# Patient Record
Sex: Female | Born: 1985 | Race: White | Hispanic: No | Marital: Married | Smoking: Never smoker
Health system: Southern US, Community
[De-identification: ages and names within clinical notes are randomized; demographics above are authoritative.]

## PROBLEM LIST (undated history)

## (undated) DIAGNOSIS — D219 Benign neoplasm of connective and other soft tissue, unspecified: Secondary | ICD-10-CM

## (undated) DIAGNOSIS — M7710 Lateral epicondylitis, unspecified elbow: Secondary | ICD-10-CM

## (undated) DIAGNOSIS — G5601 Carpal tunnel syndrome, right upper limb: Secondary | ICD-10-CM

## (undated) HISTORY — PX: DILATION AND CURETTAGE OF UTERUS: SHX78

## (undated) HISTORY — DX: Lateral epicondylitis, unspecified elbow: M77.10

## (undated) HISTORY — DX: Carpal tunnel syndrome, right upper limb: G56.01

## (undated) HISTORY — DX: Benign neoplasm of connective and other soft tissue, unspecified: D21.9

---

## 2012-04-29 ENCOUNTER — Emergency Department: Payer: Self-pay | Admitting: Emergency Medicine

## 2012-04-29 LAB — CBC
HGB: 13.5 g/dL (ref 12.0–16.0)
MCH: 32.3 pg (ref 26.0–34.0)
MCHC: 36 g/dL (ref 32.0–36.0)
RBC: 4.18 10*6/uL (ref 3.80–5.20)
RDW: 13 % (ref 11.5–14.5)
WBC: 5.2 10*3/uL (ref 3.6–11.0)

## 2012-04-29 LAB — URINALYSIS, COMPLETE
Glucose,UR: NEGATIVE mg/dL (ref 0–75)
Ketone: NEGATIVE
Ph: 6 (ref 4.5–8.0)
Protein: NEGATIVE
Specific Gravity: 1.004 (ref 1.003–1.030)
WBC UR: 1 /HPF (ref 0–5)

## 2012-04-29 LAB — HCG, QUANTITATIVE, PREGNANCY: Beta Hcg, Quant.: 41198 m[IU]/mL — ABNORMAL HIGH

## 2012-05-09 ENCOUNTER — Encounter: Payer: Self-pay | Admitting: Obstetrics and Gynecology

## 2012-07-14 ENCOUNTER — Ambulatory Visit: Payer: Self-pay | Admitting: Obstetrics & Gynecology

## 2012-07-15 ENCOUNTER — Ambulatory Visit: Payer: Self-pay | Admitting: Obstetrics & Gynecology

## 2012-07-16 ENCOUNTER — Observation Stay: Payer: Self-pay | Admitting: Obstetrics & Gynecology

## 2012-07-16 LAB — URINALYSIS, COMPLETE
Bilirubin,UR: NEGATIVE
Blood: NEGATIVE
RBC,UR: 2 /HPF (ref 0–5)
Specific Gravity: 1.031 (ref 1.003–1.030)
Squamous Epithelial: 2
WBC UR: 3 /HPF (ref 0–5)

## 2012-07-16 LAB — FETAL FIBRONECTIN

## 2012-07-16 LAB — WET PREP, GENITAL

## 2012-07-21 ENCOUNTER — Observation Stay: Payer: Self-pay

## 2012-07-21 LAB — URINALYSIS, COMPLETE
Bilirubin,UR: NEGATIVE
Ketone: NEGATIVE
Ph: 6 (ref 4.5–8.0)
RBC,UR: <1 /HPF (ref 0–5)
Specific Gravity: 1.008 (ref 1.003–1.030)

## 2012-07-21 LAB — FETAL FIBRONECTIN
Appearance: NORMAL
Fetal Fibronectin: POSITIVE

## 2012-07-23 LAB — URINE CULTURE

## 2012-07-26 ENCOUNTER — Observation Stay: Payer: Self-pay

## 2012-07-26 LAB — URINALYSIS, COMPLETE
Blood: NEGATIVE
Ketone: NEGATIVE
Leukocyte Esterase: NEGATIVE
Nitrite: NEGATIVE
Ph: 6 (ref 4.5–8.0)
Protein: NEGATIVE
RBC,UR: <1 /HPF (ref 0–5)
Specific Gravity: 1.017 (ref 1.003–1.030)
WBC UR: 1 /HPF (ref 0–5)

## 2012-08-01 ENCOUNTER — Encounter: Payer: Self-pay | Admitting: Maternal & Fetal Medicine

## 2012-08-01 ENCOUNTER — Observation Stay: Payer: Self-pay

## 2012-09-15 ENCOUNTER — Inpatient Hospital Stay: Payer: Self-pay

## 2012-09-15 LAB — CBC WITH DIFFERENTIAL/PLATELET
Basophil #: 0 10*3/uL (ref 0.0–0.1)
Eosinophil #: 0.1 10*3/uL (ref 0.0–0.7)
Eosinophil %: 1.4 %
Lymphocyte #: 1.8 10*3/uL (ref 1.0–3.6)
MCH: 31 pg (ref 26.0–34.0)
Monocyte #: 0.5 x10 3/mm (ref 0.2–0.9)
Platelet: 198 10*3/uL (ref 150–440)
RBC: 4.34 10*6/uL (ref 3.80–5.20)
WBC: 8.5 10*3/uL (ref 3.6–11.0)

## 2012-09-18 LAB — BETA STREP CULTURE(ARMC)

## 2012-09-26 ENCOUNTER — Observation Stay: Payer: Self-pay

## 2012-10-04 ENCOUNTER — Inpatient Hospital Stay: Payer: Self-pay | Admitting: Obstetrics and Gynecology

## 2012-10-04 LAB — CBC WITH DIFFERENTIAL/PLATELET
Basophil #: 0 10*3/uL (ref 0.0–0.1)
Basophil #: 0.1 10*3/uL (ref 0.0–0.1)
Basophil #: 0 10*3/uL (ref 0.0–0.1)
Basophil %: 0.1 %
Basophil %: 0.4 %
Basophil %: 0.1 %
Eosinophil #: 0.1 10*3/uL (ref 0.0–0.7)
Eosinophil #: 0 10*3/uL (ref 0.0–0.7)
Eosinophil %: 1.3 %
Eosinophil %: 0 %
Eosinophil %: 0 %
HCT: 37.1 % (ref 35.0–47.0)
HCT: 27.6 % — ABNORMAL LOW (ref 35.0–47.0)
HGB: 12.9 g/dL (ref 12.0–16.0)
HGB: 10.8 g/dL — ABNORMAL LOW (ref 12.0–16.0)
HGB: 9.5 g/dL — ABNORMAL LOW (ref 12.0–16.0)
Lymphocyte #: 1.8 10*3/uL (ref 1.0–3.6)
Lymphocyte #: 1.2 10*3/uL (ref 1.0–3.6)
Lymphocyte %: 19.8 %
Lymphocyte %: 5.4 %
MCH: 31.2 pg (ref 26.0–34.0)
MCH: 30.5 pg (ref 26.0–34.0)
MCH: 30.8 pg (ref 26.0–34.0)
MCHC: 34.7 g/dL (ref 32.0–36.0)
MCHC: 33.9 g/dL (ref 32.0–36.0)
MCHC: 34.4 g/dL (ref 32.0–36.0)
MCV: 90 fL (ref 80–100)
MCV: 90 fL (ref 80–100)
MCV: 90 fL (ref 80–100)
Monocyte #: 0.7 x10 3/mm (ref 0.2–0.9)
Monocyte #: 0.7 x10 3/mm (ref 0.2–0.9)
Monocyte %: 7.6 %
Monocyte %: 3.4 %
Neutrophil #: 6.4 10*3/uL (ref 1.4–6.5)
Neutrophil #: 18.5 10*3/uL — ABNORMAL HIGH (ref 1.4–6.5)
Neutrophil #: 19.7 10*3/uL — ABNORMAL HIGH (ref 1.4–6.5)
Neutrophil %: 71.2 %
Neutrophil %: 88.1 %
Neutrophil %: 91.1 %
Platelet: 173 10*3/uL (ref 150–440)
Platelet: 281 10*3/uL (ref 150–440)
Platelet: 277 10*3/uL (ref 150–440)
RBC: 4.13 10*6/uL (ref 3.80–5.20)
RBC: 3.54 10*6/uL — ABNORMAL LOW (ref 3.80–5.20)
RBC: 3.08 10*6/uL — ABNORMAL LOW (ref 3.80–5.20)
RDW: 17.1 % — ABNORMAL HIGH (ref 11.5–14.5)
RDW: 17.1 % — ABNORMAL HIGH (ref 11.5–14.5)
RDW: 17.3 % — ABNORMAL HIGH (ref 11.5–14.5)
WBC: 9 10*3/uL (ref 3.6–11.0)
WBC: 21.6 10*3/uL — ABNORMAL HIGH (ref 3.6–11.0)

## 2012-10-05 LAB — CBC WITH DIFFERENTIAL/PLATELET
Basophil %: 0.2 %
Eosinophil #: 0.1 10*3/uL (ref 0.0–0.7)
Eosinophil %: 0.4 %
HCT: 25.9 % — ABNORMAL LOW (ref 35.0–47.0)
Lymphocyte #: 2.7 10*3/uL (ref 1.0–3.6)
Lymphocyte %: 17.8 %
MCH: 30.1 pg (ref 26.0–34.0)
Monocyte #: 1.4 x10 3/mm — ABNORMAL HIGH (ref 0.2–0.9)
Neutrophil %: 72.6 %
Platelet: 202 10*3/uL (ref 150–440)
RBC: 2.9 10*6/uL — ABNORMAL LOW (ref 3.80–5.20)
RDW: 17.7 % — ABNORMAL HIGH (ref 11.5–14.5)
WBC: 15.4 10*3/uL — ABNORMAL HIGH (ref 3.6–11.0)

## 2012-10-07 LAB — HEMATOCRIT: HCT: 19.9 % — ABNORMAL LOW (ref 35.0–47.0)

## 2012-10-07 LAB — HEMOGLOBIN: HGB: 7 g/dL — ABNORMAL LOW (ref 12.0–16.0)

## 2012-10-08 LAB — CBC
HCT: 26.6 % — ABNORMAL LOW (ref 35.0–47.0)
MCHC: 34.5 g/dL (ref 32.0–36.0)
Platelet: 256 10*3/uL (ref 150–440)
RBC: 3 10*6/uL — ABNORMAL LOW (ref 3.80–5.20)
RDW: 17.3 % — ABNORMAL HIGH (ref 11.5–14.5)

## 2012-10-09 LAB — HEMATOCRIT: HCT: 27.8 % — ABNORMAL LOW (ref 35.0–47.0)

## 2013-12-27 IMAGING — US US OB >= 14 WKS - NRPT
1 series · 14 of 28 positions shown · non-contrast
Comparison: none

[Series 1: us ob >= 14 wks - nrpt · 0.18mm/px · 14 of 89 slices shown]
[im 4/89]
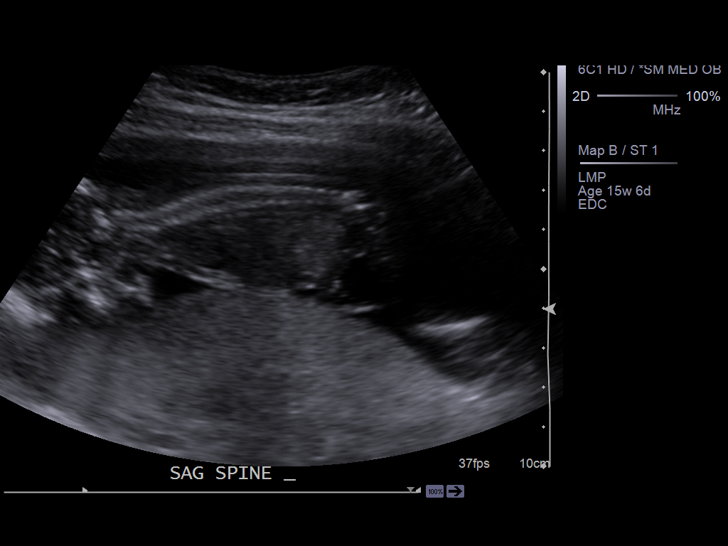
[im 10/89]
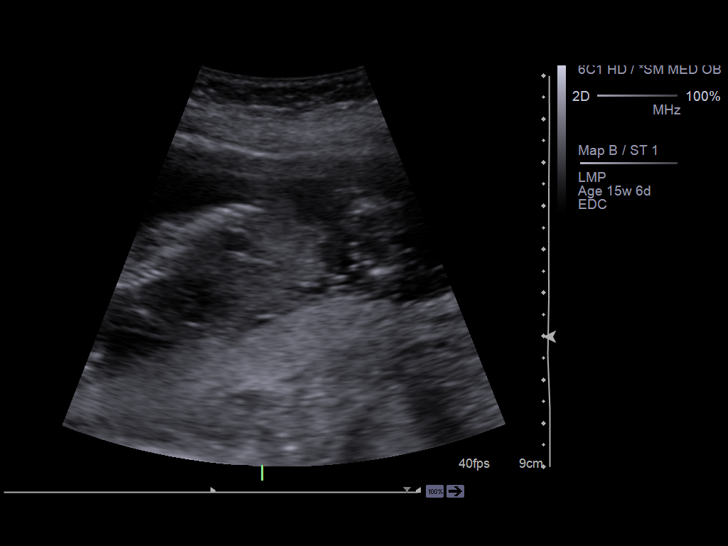
[im 17/89]
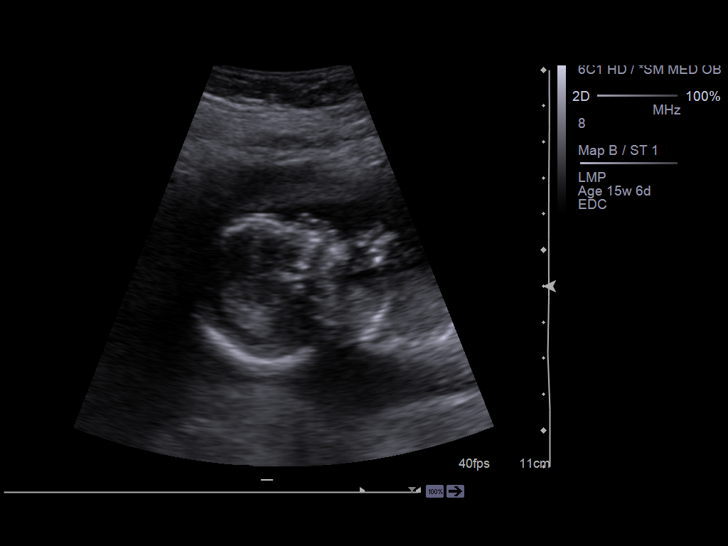
[im 23/89]
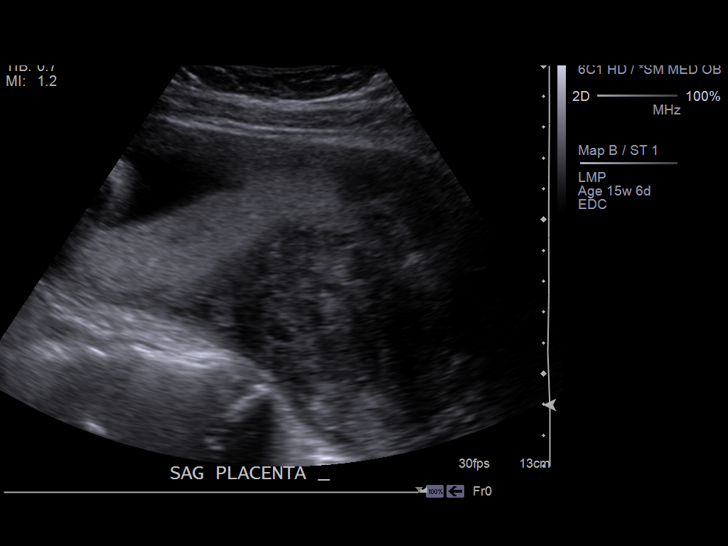
[im 30/89]
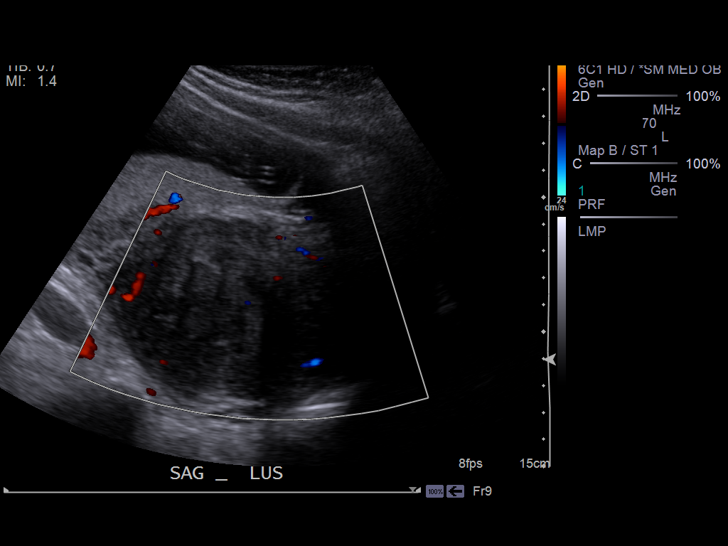
[im 36/89]
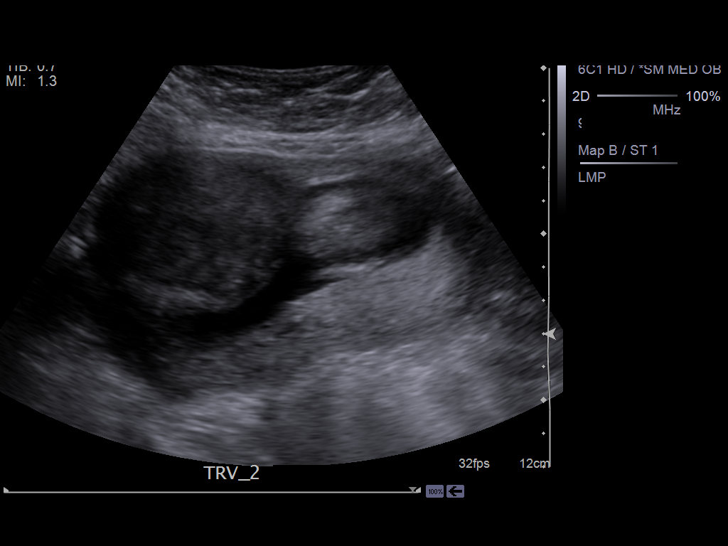
[im 43/89]
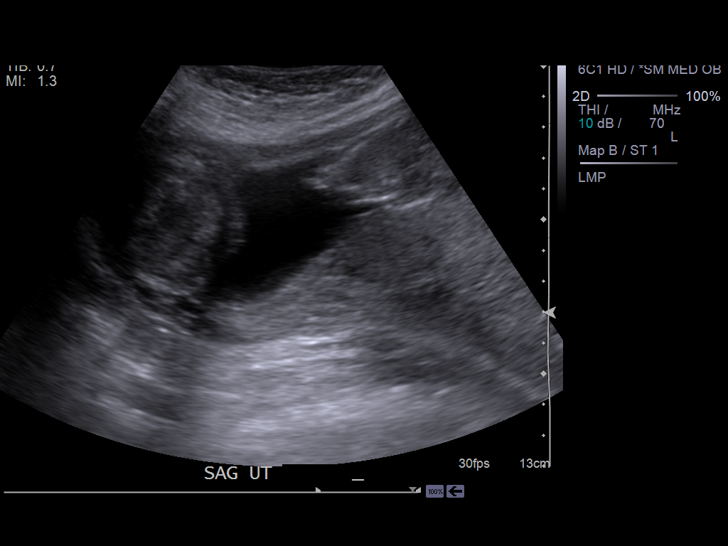
[im 49/89]
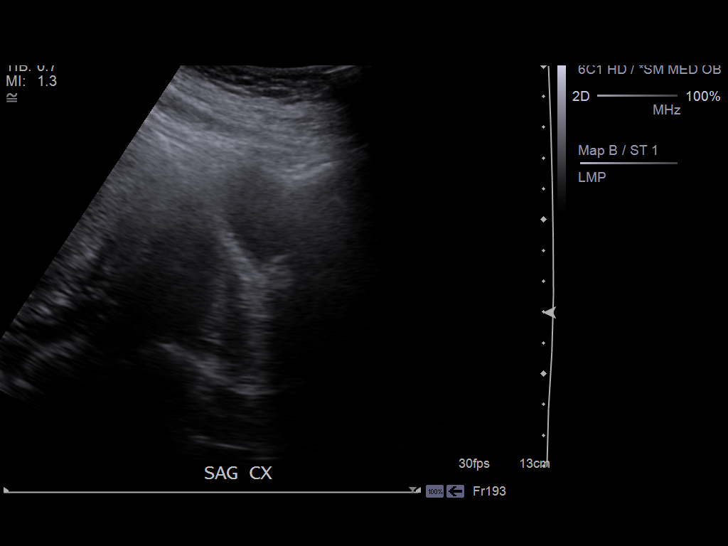
[im 56/89]
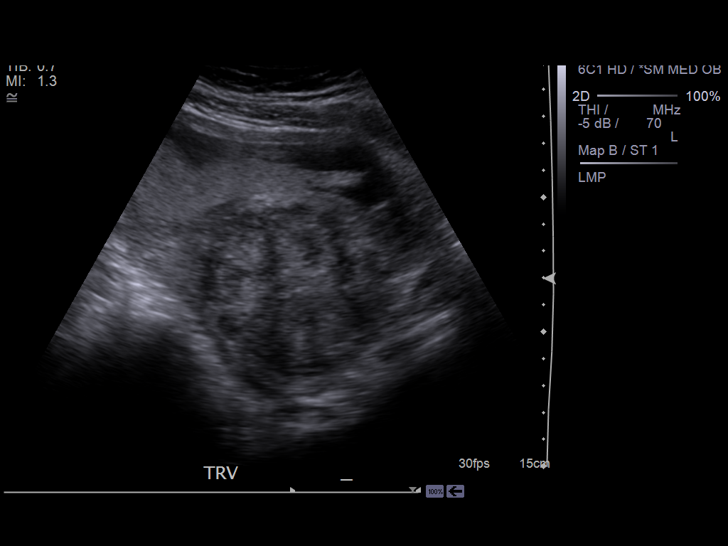
[im 62/89]
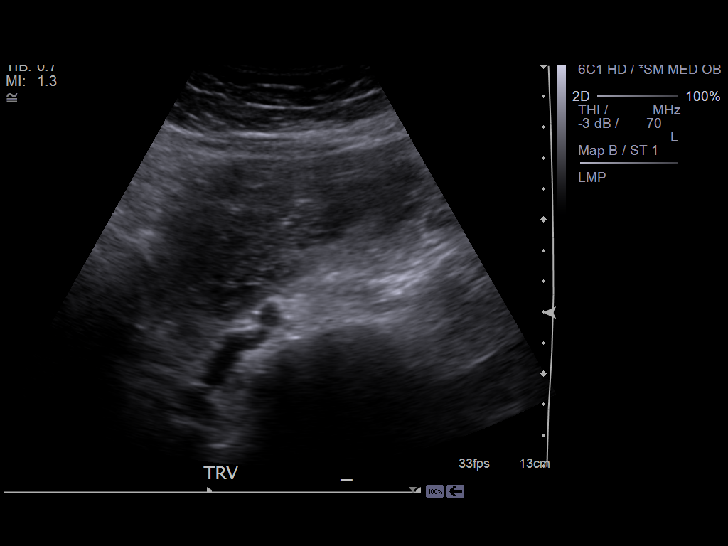
[im 69/89]
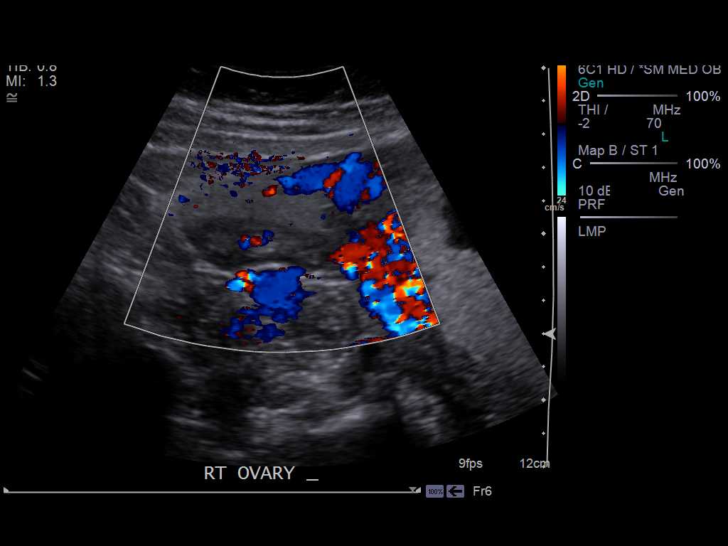
[im 75/89]
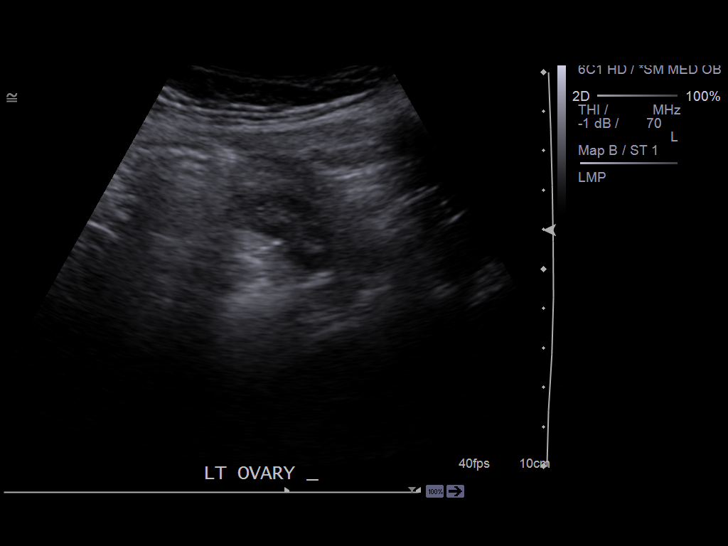
[im 82/89]
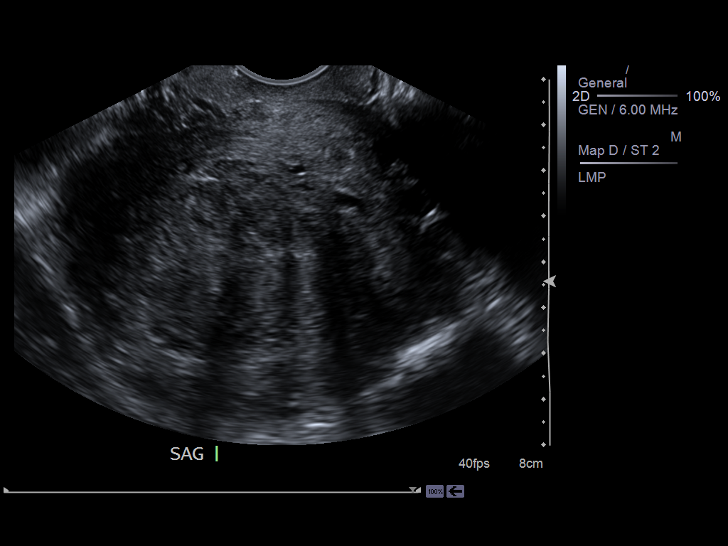
[im 89/89]
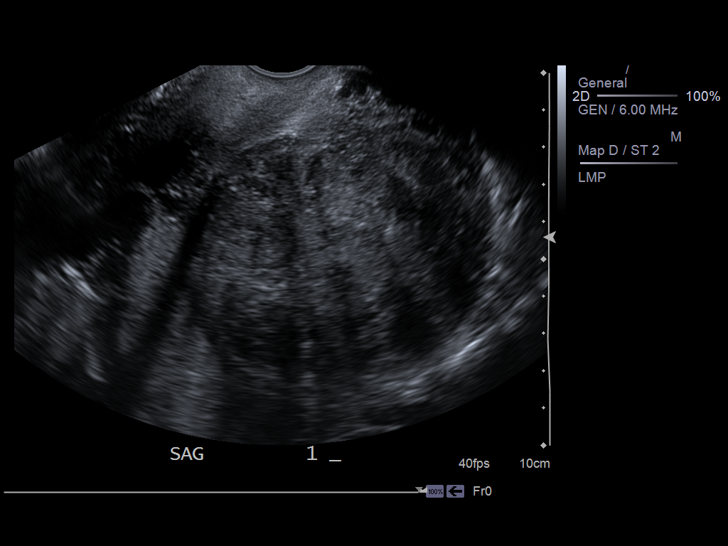

[14 of 28 positions shown; findings below may reference images not displayed]

IMAGES IMPORTED FROM THE [employer] WORKFLOW SYSTEM
NO DICTATION FOR STUDY

## 2014-03-21 IMAGING — US US OB DETAIL+14 WK - NRPT [facility]
1 series · 14 of 28 positions shown · non-contrast
Comparison: none

[Series 1: us ob detail+14 wk - nrpt [facility] · 0.29mm/px · 14 of 86 slices shown]
[im 4/86]
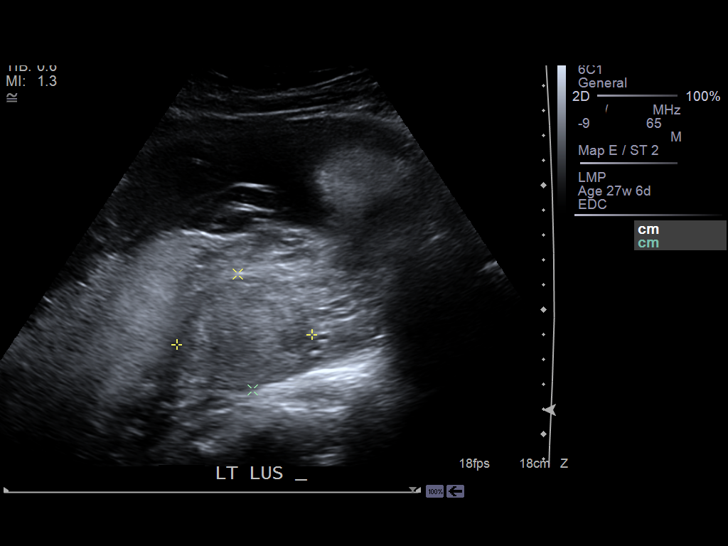
[im 10/86]
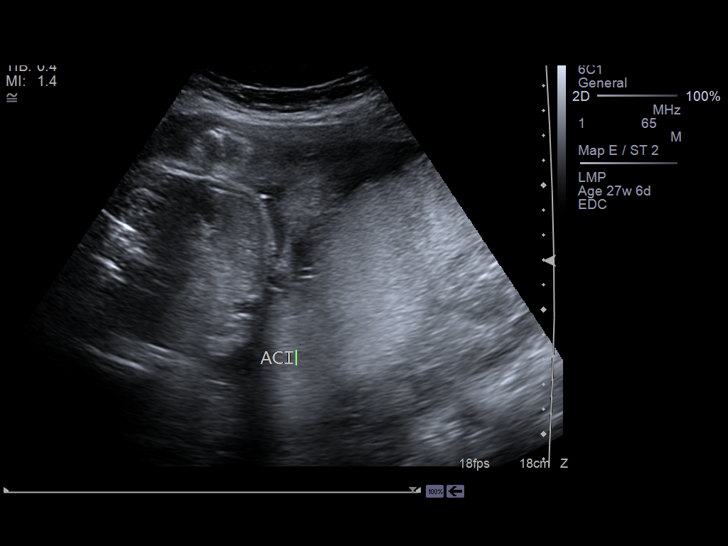
[im 16/86]
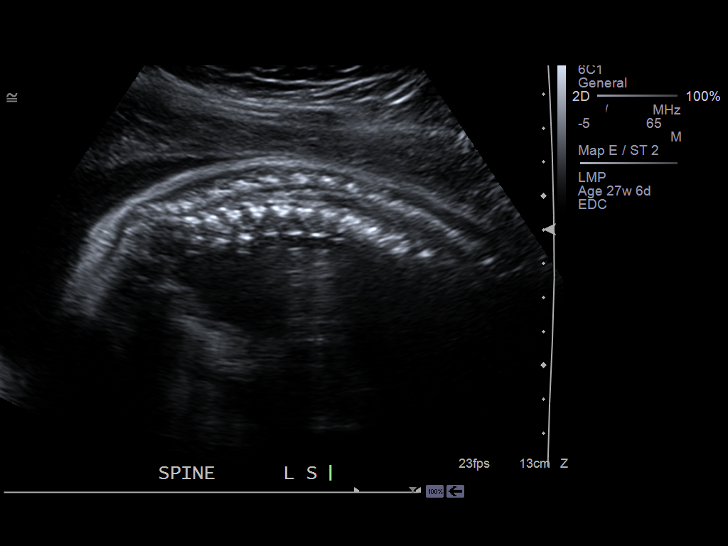
[im 23/86]
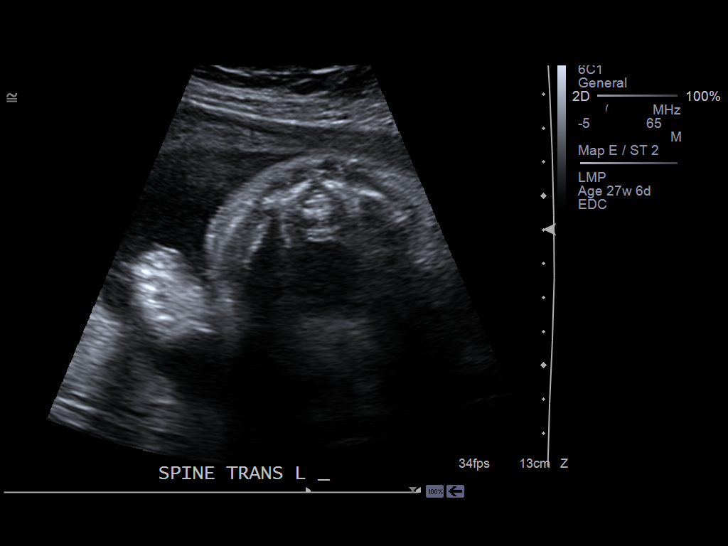
[im 29/86]
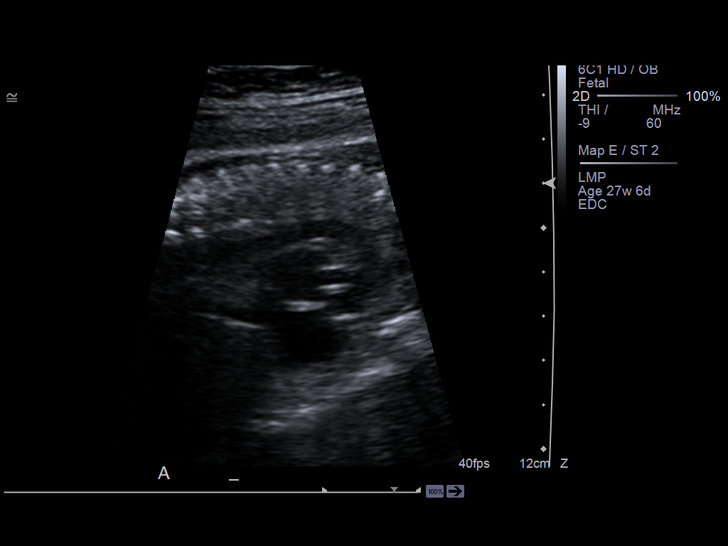
[im 35/86]
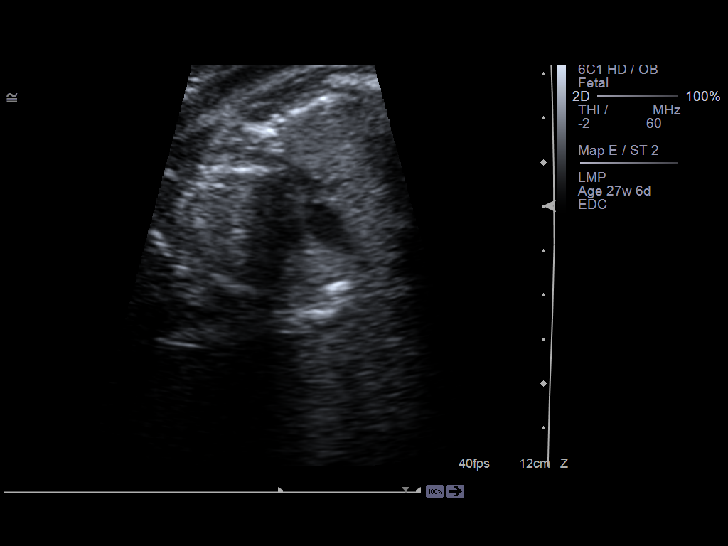
[im 41/86]
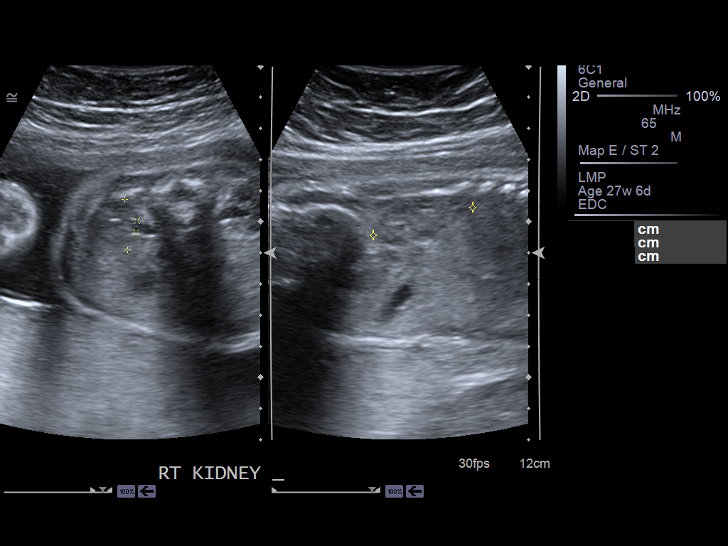
[im 48/86]
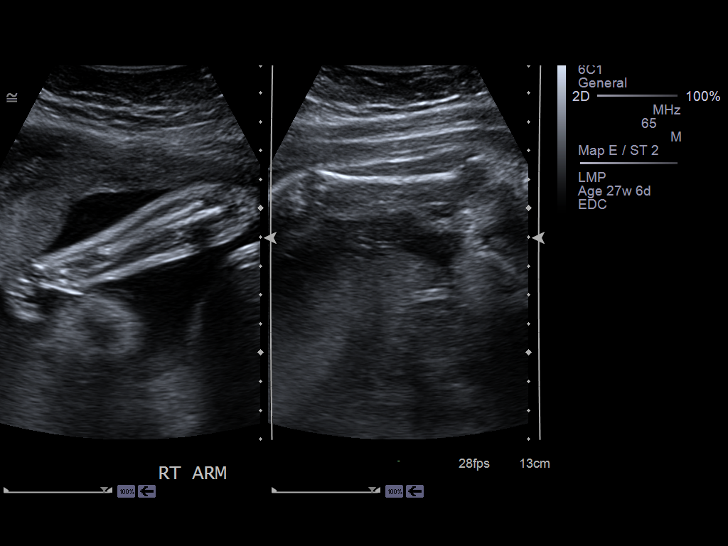
[im 54/86]
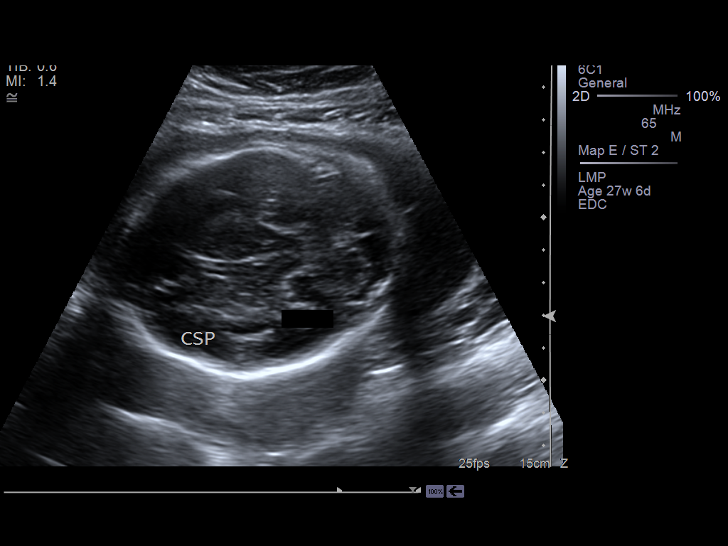
[im 60/86]
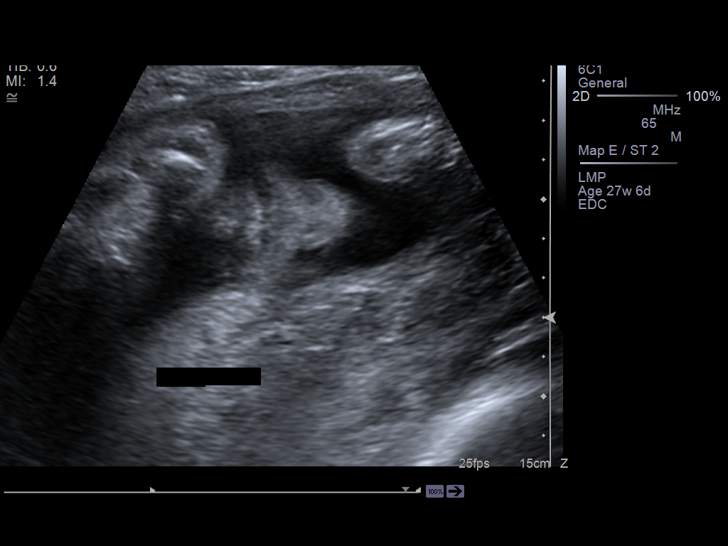
[im 67/86]
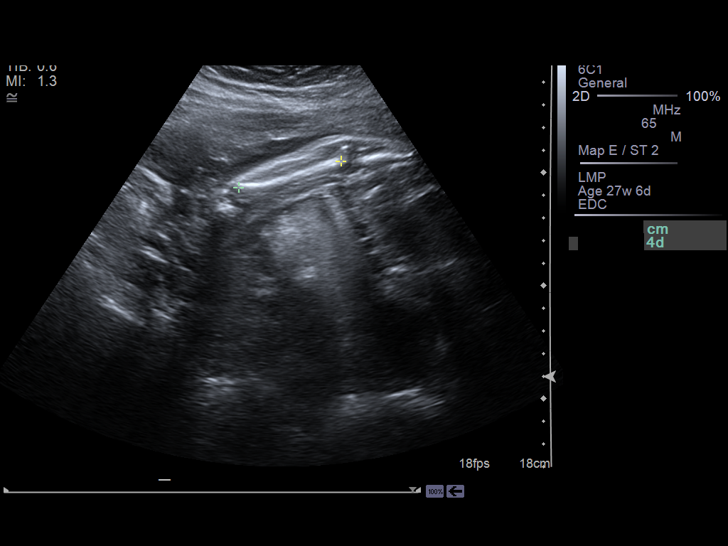
[im 73/86]
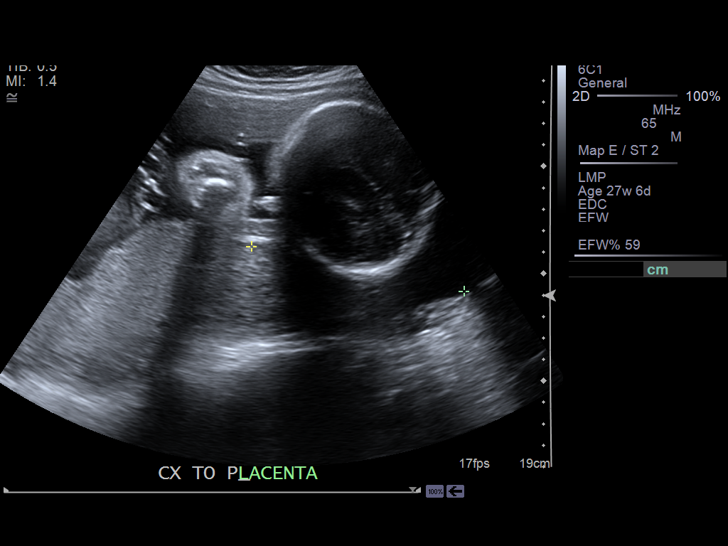
[im 79/86]
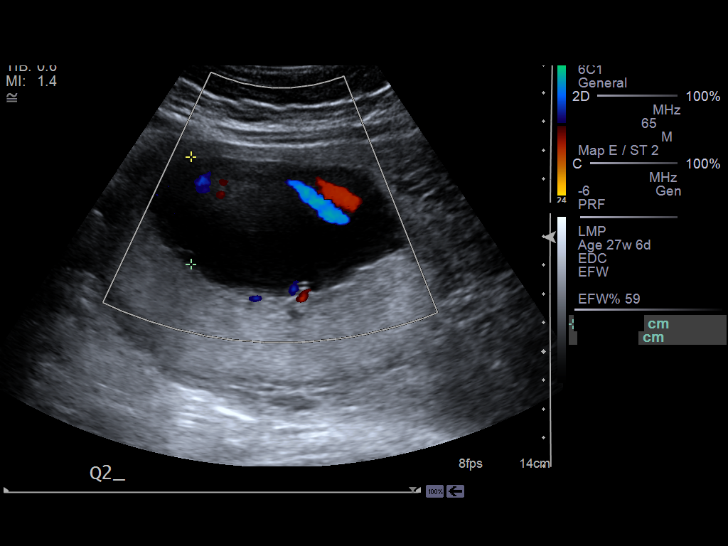
[im 86/86]
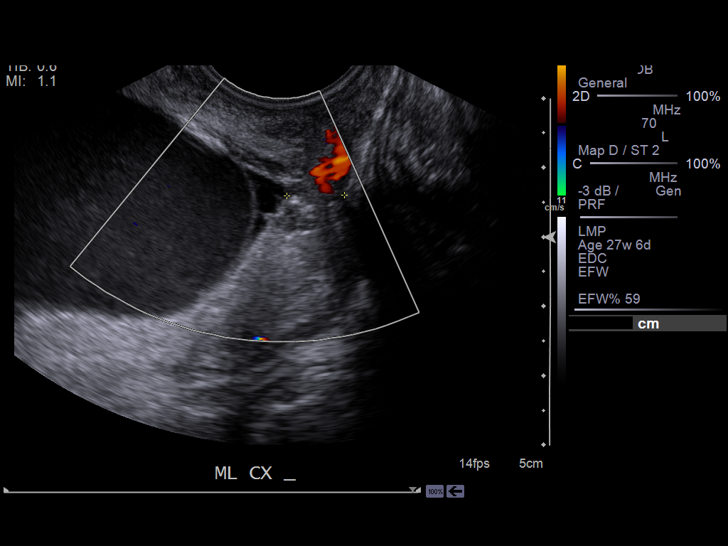

[14 of 28 positions shown; findings below may reference images not displayed]

IMAGES IMPORTED FROM THE [employer] WORKFLOW SYSTEM
NO DICTATION FOR STUDY

## 2014-05-24 IMAGING — US TRANSABDOMINAL ULTRASOUND OF PELVIS
2 series · 14 of 25 positions shown · non-contrast
Comparison: none

REASON FOR EXAM: PP Bleeding, Known Fibroids
COMMENTS:

[Series 1: transabdominal ultrasound of pelvis · 0.35mm/px · 13 of 31 slices shown (1 of 2)]
[im 1/31]
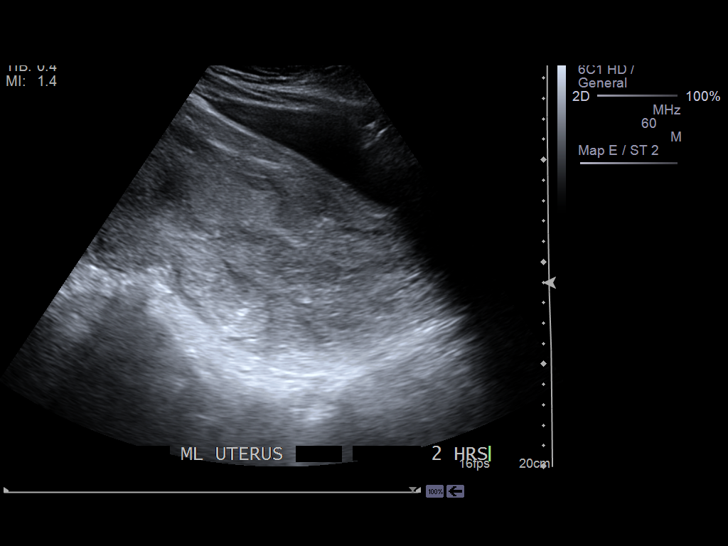
[im 3/31]
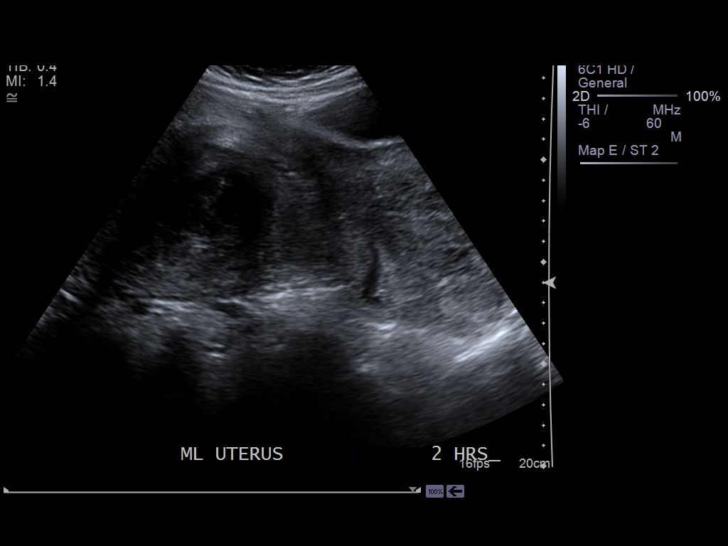
[im 6/31]
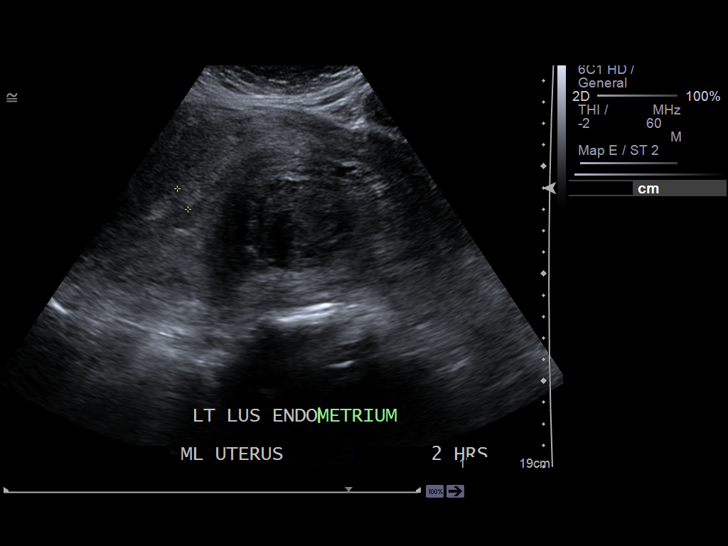
[im 8/31]
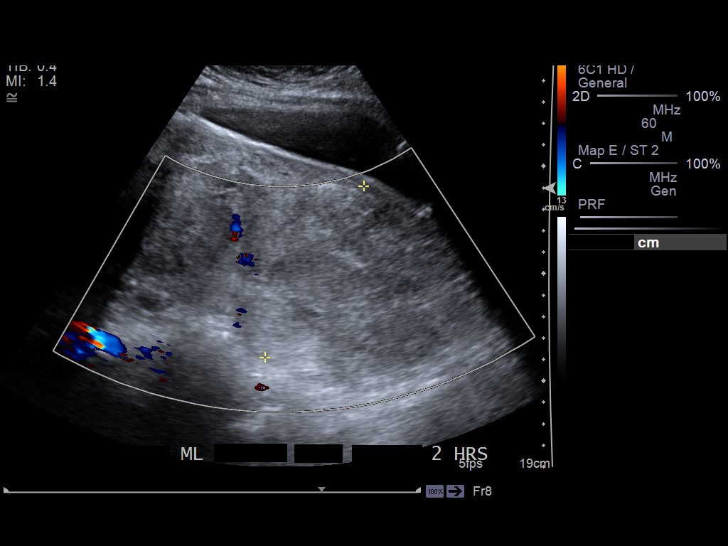
[im 11/31]
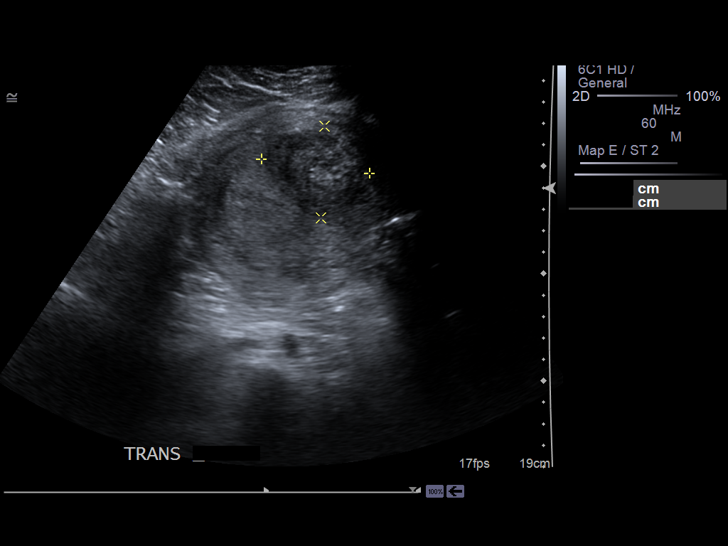
[im 12/31]
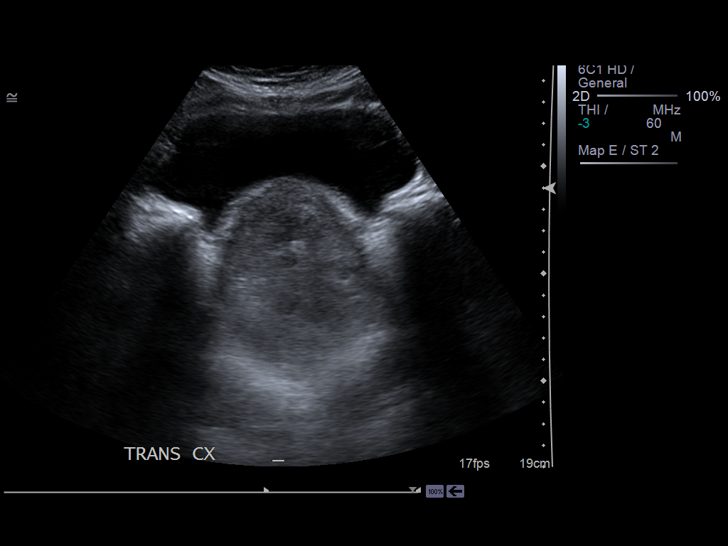
[im 15/31]
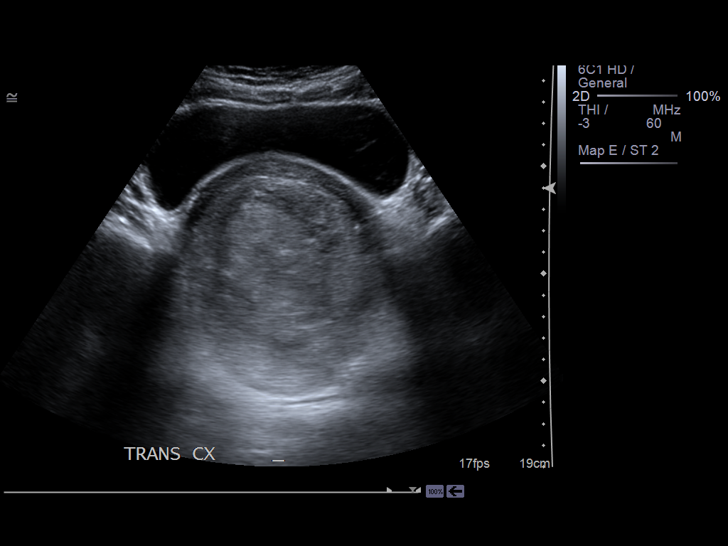
[im 17/31]
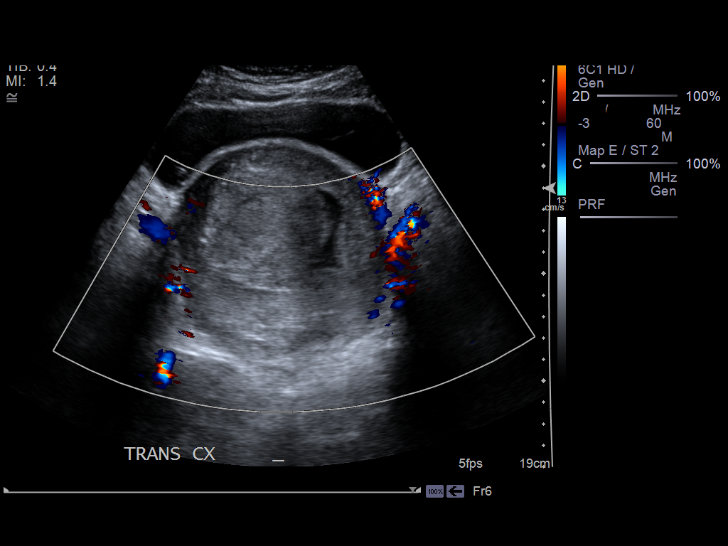
[im 20/31]
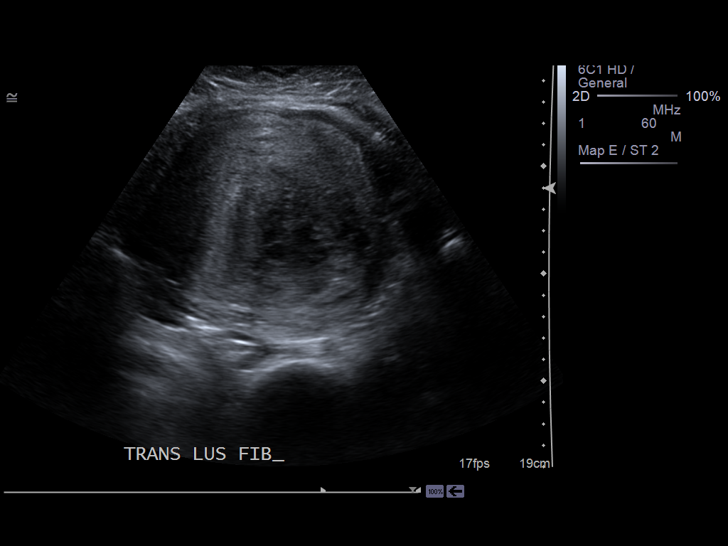
[im 21/31]
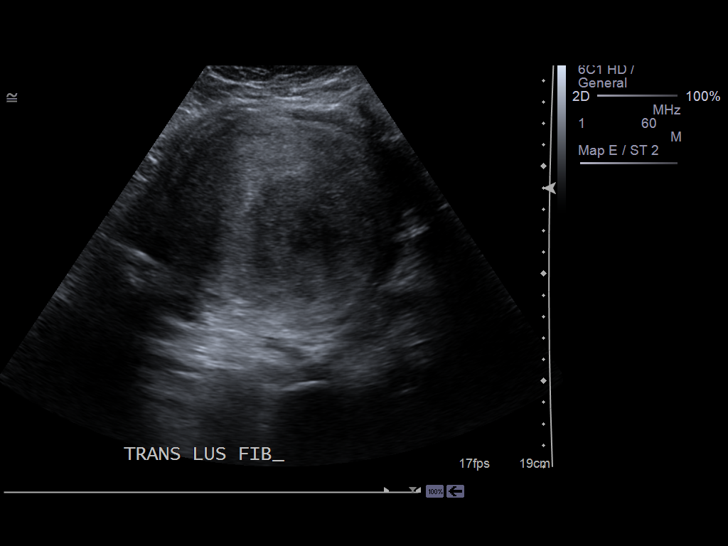
[im 24/31]
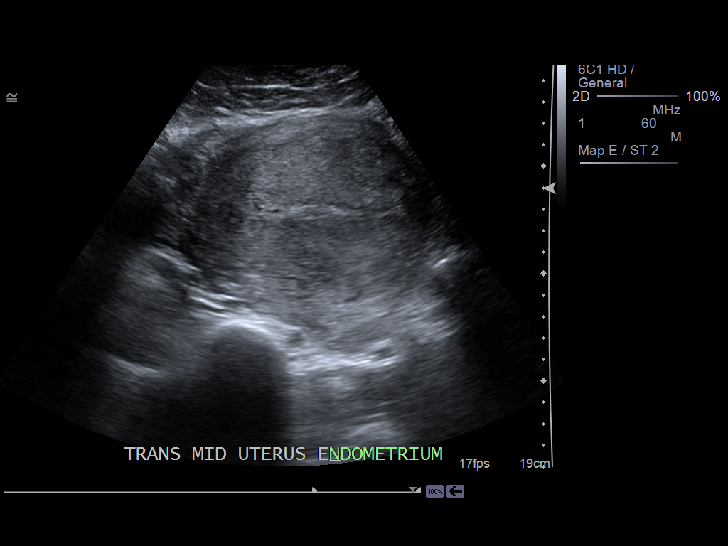
[im 27/31]
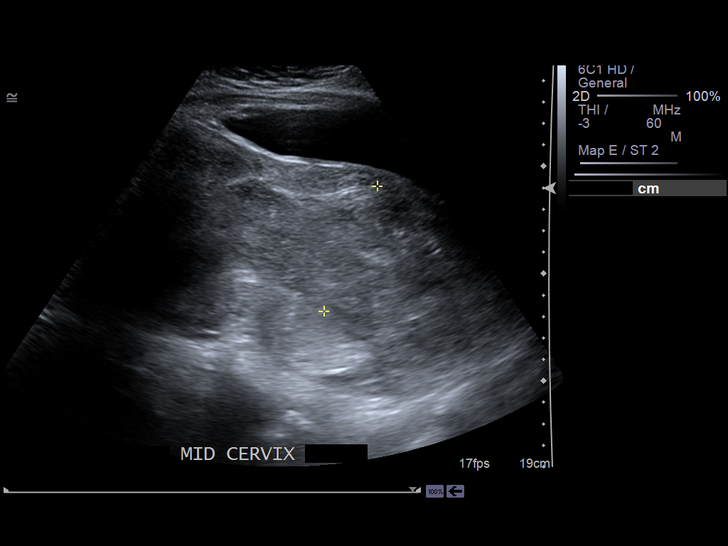
[im 29/31]
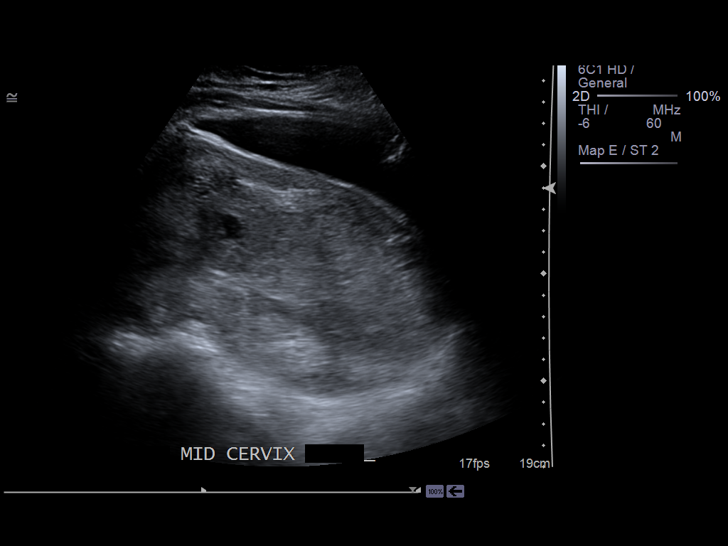

[Series 2: transabdominal ultrasound of pelvis · 0.35mm/px · 1 of 1 slices shown (2 of 2)]
[im 1/1]
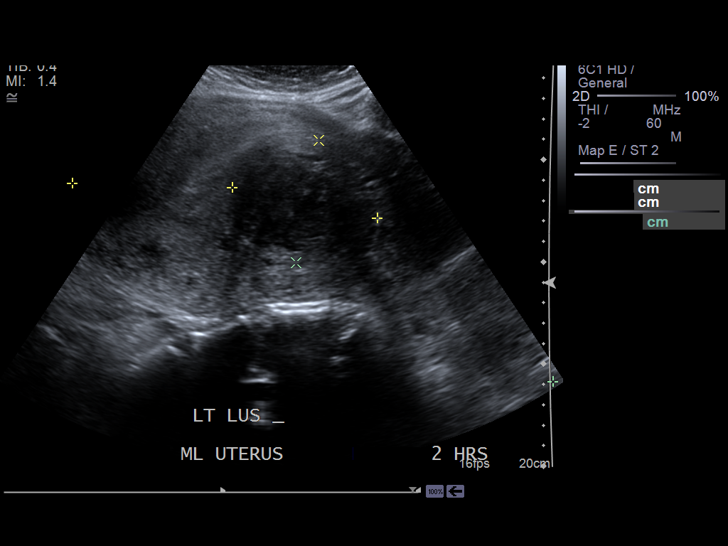

[14 of 25 positions shown; findings below may reference images not displayed]

PROCEDURE:     US  - US PELVIS EXAM  - October 04, 2012  [DATE]

RESULT:     Transabdominal and assessment demonstrates the uterus measures
up to approximately 25 cm in length by 11.20 x 9.53 cm. There is an abnormal
hypoechoic heterogeneous lesion in the fundus the uterus measuring 5.08 x
4.28 x 3.42 cm. There is a lower uterine segment posterior fibroid measuring
6.3/7 by 7.26 x 6.09 cm. Is an abnormal appearance of the cervix with a
mixed echotexture region which could represent evidence of clotted blood.
Placental remnants or not excluded. Correlate clinically. Endometrial stripe
thickness is 9.2 mm.
IMPRESSION: 1. Nonvisualization of the ovaries.
2. Enlarged postpartum uterus with 2 fibroids seen as demonstrated on
previous ultrasound.
3. Abnormal echotexture in the area the cervix which could represent clot.
Some retained placental component is not completely excluded.

[REDACTED]

## 2014-09-11 NOTE — Consult Note (Signed)
Referral Information:   Reason for Referral Fibroids in pregnancy    Referring Physician [facility]    Prenatal Hx Patient has a history of a hysteroscopic sumucosal fibroid resection in January of 2013 after a fibroid was discovered during a D&C after a miscarriage.  Currently, she has had an uncomplicated pregnancy until 12/6 (at 14+ weeks) when she woke up bleeding which lasted about 6 hours and she described as greater than a period.  She was evaluated in the ER and no obvious etiology for the bleeding was found.  She has 2 known fibroids which have been documented since 7 weeks 6 days and have remained essentially unchanged in size.  She had a repeat episode of bleeding on 12/7 which lasted for an hour but has had no bleeding since.  Hct on 12/6 was 37.5.    Past Obstetrical Hx 1.  Missed abortion 04/2011 - D&C (was about 8 weeks by LMP but no embryo seen in the uterus - D&C performed due to a lot of "tissue" but patient states it was not a molar pregnancy) 2.  Current   Home Medications: Medication Status  prenatal vitamijns daily Active   Allergies:   Sulfa drugs: Hives  Vital Signs/Notes:  Nursing Vital Signs: **Vital Signs.:   16-Dec-13 12:58   Vital Signs Type Routine   Temperature Temperature (F) 97   Celsius 36.1   Pulse Pulse 79   Respirations Respirations 18   Systolic BP Systolic BP 379   Diastolic BP (mmHg) Diastolic BP (mmHg) 82   Mean BP 95   Perinatal Consult:   PGyn Hx Irregular menses; known fibroids; no abnormal PAPs or STDs    PMed Hx Rubella Immune, Hx of varicella    Past Medical History cont'd Scoliosis - never treated    PSurg Hx D&C, hysteroscopic fibroid resection, moles removed    FHx Mom with diabetes and skin cancer; Dad s/p MI and subsequent stent placement; sister with scoliosis and recent 70 week PTD    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   ____________________________________________________________________________  History: Age: 29 years.  ____________________________________________________________________________  Dating:  LMP:  01/19/2012     EDC:     10/25/2012     GA by LMP:     [redacted]w[redacted]d  Earlier Assessment on:     03/14/2012     EDC:     10/25/2012     GA by earlier  assessment:     [redacted]w[redacted]d  Current Scan on:     05/09/2012     EDC:     10/21/2012     GA by current scan:    [redacted]w[redacted]d  Best Overall Assessment:     05/09/2012     EDC:     10/25/2012     Assessed  GA:      [redacted]w[redacted]d  The calculation of the gestational age by current scan was based on BPD, HC, FL  and HUM.  The Best Overall Assessment is based on an earlier assessment on  03/14/2012.  ____________________________________________________________________________  Anatomy Scan:  Singleton gestation.  Biometry:  BPD     33.9     mm      -     [redacted]w[redacted]d     ([redacted]w[redacted]d to [redacted]w[redacted]d)  HC     128.0     mm      -     [redacted]w[redacted]d     ([redacted]w[redacted]d to [redacted]w[redacted]d)  FL     20.4     mm      -     [redacted]w[redacted]d     ([redacted]w[redacted]d to [redacted]w[redacted]d)  HUM     21.1     mm      -     [redacted]w[redacted]d     Fetal heart activity: present. Fetal heart rate: 145 bpm.   Fetal presentation: breech.   Amniotic fluid: normal.   Placenta: posterior.     ____________________________________________________________________________  Maternal Structures:  Uterus: Fibroids: Fibroid 1: Size: 97 mm x 95 mm x 87 mm. Position: right  lateral anterior wall.   Fibroid 2: Size: 53 mm x 44  mm x 45 mm. Position: posterior.    Right Ovary: normal.   Right Ovary size: 40 mm x 17 mm x 17 mm. Volume: 6.1 ml.   Left Ovary: normal.   Left Ovary size: 39 mm x 15 mm x 17 mm. Volume: 5.2 ml.  ____________________________________________________________________________  Report Summary:  Impression: Single intrauterine pregnancy with an estimated gestational age of  [redacted] weeks 6 day(s).  Dating assigned by earliest available ultrasound performed  at [place] on 03/14/2012; measurements at that exam reported as 7 weeks  6 days.  Patient states menses range from every 28 to every 40 days.  Exam  limited to viability, biometry and mapping of uterine fibroids.    Due to early gestational age, detailed fetal anatomy was not  visualized.    There are 2 large fibroids identified.  The largest appears posteriorly in  the lower uterine segment, almost directly posterior to the cervix and is  retroplacental, measuring 9.7 x 9.5 x 8.7 cm (previously 9.5 x 8.8 x 9.6 cm).    The second is right lateral anterior/fundal and measures 5.3 x 4.4 x 4.5 cm  (previously 6.4 x 4.2 x 6.1 cm).      Maternal ovaries appear normal.    Transvaginal ultrasound was performed to assess cervical position which is  displaced anteriorly and measures at least 3.3 cm.    Findings were discussed.  See consult for details and recommendations.   Recommend follow up ultrasound in 2 weeks to assess fetal anatomy; we would be  more than happy to see the patient back for this if desired.  Would also  recommend serial ultrasounds for growth after 26-28 weeks. CODING DESCRIPTION:   Fibroid uterus.   Recommendations:     Thank you for allowing [place] to participate in her care.  Electronically signed by:  Wynona Neat, MD     Additional Lab/Radiology Notes First trimester screen results within normal range Maternal blood type Rh+   Impression/Recommendations:   Impression 29yo G2P0010 at [redacted]w[redacted]d with 2 large  fibroids and recent episode of bleeding.  Etiology of the bleeding is uncertain but is unlikely to be secondary to the  fibroids given the short duration.  We then discussed the implications and potential complications related to the fibroids and her pregnancy.    Recommendations The anterior/fundal fibroid appears mostly intramural and is unlikely to impact the pregnancy significantly given it's location, although it may grow in size.  The posterior, retroplacental, retrocervical fibroid very well may impact the ability of the patient to delivery vaginally.  Fortunately, is appears posterior which should make hysterotomy and repair less complicated although it would be important to closely evaluate the lower uterine segment anatomy given it's distortion of the cervical position and potential need to make a higher incision than anticipated.  This fibroid, given it's retroplacental position and size increases the patient's risk for abruption, preterm premature rupture of membranes, preterm labor/delivery, fetal growth restriction, possible malpresentation and probable need for cesarean section for delivery.  We discussed that fibroids can grow, stay the same size, or potentially get smaller during the course of a pregnancy.  They can also outgrow their blood supply, or degenerate, which can potentially cause an inflammatory reaction and contribute to preterm labor symptoms.    We discussed symptoms of preterm labor and abruption so that the patient is aware.  Concern for a short cervix is lessened given that this fibroid actually impinges/compresses the cervix.  Recommend ultrasound in about 2 weeks for fetal anatomy.  We would be more than happy to see the patient back for this indication if desired.  Would then recommend monthly ultrasounds about 26-28 weeks to assess growth and fibroid size.    Midtrimester would recommend anesthesia consult given patient's history of scoliosis.  Recommend follow up  with MFM at about 28-32 weeks to reevaluate fibroids/size/fetal growth and help with delivery recommendations.  Of note, the patient does have significant constipation with blood noted after bowel movements, which is likely secondary to the position of the LUS fibroid.  We discussed symptomatic options such as stool softeners.   Plan:   Prenatal Diagnosis Options Level II [place]    Ultrasound at what gestational ages Monthly > 28 weeks     Total Time Spent with Patient 30 minutes    >50% of visit spent in couseling/coordination of care yes    Office Use Only 99242  Level 2 (42min) NEW office consult exp prob focused   Coding Description: MATERNAL CONDITIONS/HISTORY INDICATION(S).   Fibroids.  Electronic Signatures: Wynona Neat (MD)  (Signed 16-Dec-13 14:42)  Authored: Referral, Home Medications, Allergies, Vital Signs/Notes, Consult, Exam, Radiology, Lab/Radiology Notes, Impression, Plan, Billing, Coding Description   Last Updated: 16-Dec-13 14:42 by Wynona Neat (MD)

## 2014-09-14 NOTE — Consult Note (Signed)
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 Pt required admission at [facility] for a short cervix and was discharged at 30 weeks . She was recently admitted to [facility] at 4-5 cm dilated and has remained unchanged since last thursday - consult obtained to discuss management . Pt notes she is at home alone during the day while her husband works 20 minutes away . She lives approximately 20 minutes from the hospital . She expresses some concern because earlier inthe pregnancy she did not feel contractions well .   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   Soc Hx married   Review Of Systems:  Subjective Frustrated with pregnancy difficulties     Routine BB:  24-Apr-14 19:36   ABO Group + Rh Type A Positive  Antibody Screen NEGATIVE (Result(s) reported on 15 Sep 2012 at 08:40PM.)  Crossmatch Unit 1 Cancelled  Crossmatch Unit 2 Cancelled  28-Apr-14 05:43   ABO Group + Rh Type A Positive  Antibody Screen NEGATIVE (Result(s) reported on 19 Sep 2012 at 07:21AM.)  Crossmatch Unit 1 Cancelled  Crossmatch Unit 2 Cancelled  Routine Micro:  24-Apr-14 17:14   Micro Text Report BETA STREP CULTURE   COMMENT                    NO BETA STREPTOCOCCUS ISOLATED IN 48 HOURS   ANTIBIOTIC                       Specimen Source VAGINAL  Culture Comment NO BETA STREPTOCOCCUS ISOLATED IN 48 HOURS  Result(s) reported on 18 Sep 2012 at 10:08AM.  General Ref:  24-Apr-14 19:36   RPR (STS) ========== TEST NAME ==========  ========= RESULTS =========  = REFERENCE RANGE =  RPR  RPR RPR                             [   Non Reactive         ]      Non Reactive               [facility]            No: [id number]           [address], [place], [place] [address]           Lindon Romp, MD         [phone]   Result(s) reported on 17 Sep 2012 at 03:49AM.  Routine Chem:  24-Apr-14 19:36   Result Comment XMATCH - PRODUCTS NOT INDICATED.HEMOGLOBIN IS  - 13.4  Result(s) reported on  17 Sep 2012 at 11:04AM.  28-Apr-14 05:43   Result Comment XMATCH - XMATCH CANCELED. NO INDICATIONS FOR  - RED CELL TRANSFUSION. SPOKE WITH  Baker Janus, MOTHER/BABY AT [phone] 09/19/2012.  - TFK  Result(s) reported on 19 Sep 2012 at 08:24AM.  Routine Hem:  24-Apr-14 19:36   WBC (CBC) 8.5  RBC (CBC) 4.34  Hemoglobin (CBC) 13.4  Hematocrit (CBC) 39.1  Platelet Count (CBC) 198  MCV 90  MCH 31.0  MCHC 34.4  RDW  16.6  Neutrophil % 70.6  Lymphocyte % 21.5  Monocyte % 6.4  Eosinophil % 1.4  Basophil % 0.1  Neutrophil # 6.0  Lymphocyte # 1.8  Monocyte # 0.5  Eosinophil # 0.1  Basophil # 0.0 (Result(s) reported on 15 Sep 2012 at 08:04PM.)    Additional Lab/Radiology Notes 3/25 u/s at [facility] - fibroid did not appear obstructive - report left on chart  4/28 bedside scan - ceph - fibroid posterior  to the right , no obvious obstruction cervix check - 5/60% /-2 no tension on cervix - I was not able to ballotte the fetal head out of the pelvis   Impression/Recommendations:  Impression 1 . Advanced dilation at 34 6/7 weeks - cervix stable for  several days, no evidence of infection, cephalic  2 . large fibroids do not appear  obstructive   Recommendations 1. I discussed risks /benefits of delivery versus expectant care in hospital versus home with advanced dilation. I reviewed signs sxs of ROM and labor .  At 34 6/7 weeks in absence of infection, I think expectant care at home is reasonable . Risk of cord prolapse is low with engaged head . Risk of uterine rupture in absence of obstructing fibroid is also low . There is a low risk in this nulliparous woman of precipitous delivery at home . I suggested she have someone in the home  with her if possible ,but  if labor starts and she is alone to call emergency services. Risk to the fetus of being born out of hospital if precipitous birth is less in this late premie  than early gestational ages. We discussed that as she approaches 37 weeks , it is a physician judgment call about whether to admit and induce to control birth timing and attempt to avoid unassisted birth or chorioamnionitis. I think prematurity exceeds this risk currently. 2 . we discussed future pregnancies briefly ( pt states this is an only child) and whether prophylactic cerclage would be indicated.Ithink this is unclear given she has achieved 34 weeks with activity modification.    Comments Plan induction/augmentation based on cervix and clinician judgment as pregnancy progresses. If no change consider induciton at 37-39 weeks .    Total Time Spent with Patient 15 minutes   >50% of visit spent in couseling/coordination of care yes   Office Use Only 99251  INPT Consult Problem Focused (20 min)   Coding Description: OTHER: advanced dilation at 34 6/7 weeks.  Electronic Signatures: Sharyn Creamer (MD)  (Signed 28-Apr-14 13:28)  Authored: Referral, Home Medications, Allergies, Vital Signs/Notes, Consult, Exam, Lab, Lab/Radiology Notes, Impression, Other Comments, Billing, Coding Description   Last Updated: 28-Apr-14 13:28 by Sharyn Creamer (MD)

## 2014-09-14 NOTE — Consult Note (Signed)
   Maternal Age 29   Gravida 2   Para 0   Term Deliveries 0   Preterm Deliveries 0   Abortions 1   Living Children 0   GA Assessment: (Weeks) 34 week(s)   (Days) 2 day(s)   Gestation Single   Blood Type (Maternal) A positive   Antibody Screen Results (Maternal) negative   Indirect Coombs Negative   HIV Results (Maternal) negative   Herpes Results (Maternal) n/a   VDRL/RPR/Syphilis Results (Maternal) negative   Rubella Results (Maternal) immune   Hepatitis B Surface Antigen Results (Maternal) negative   Group B Strep Results Maternal (Result >5wks must be treated as unknown) unknown/result > 5 weeks ago   Prenatal Care Adequate    Additional Comments Mrs. Sieg has been followed by [facility] and [facility] for preterm labor (has received betamethasone x 2 courses) and her large leiomyomas.  She is s/p hysteroscopic myomectomy.  Mother presents to L&D at 4 cm dilated with bulging membranes.    I discussed with both parents the expected course of a 34 week infant including respiratory support, feeding difficulties, temperature regulation, likely length of stay, etc.  Parents asked appropriate questions.  I am available for further questions as they arise.  Will plan to be present for delivery and admit baby to special care nursery.   Electronic Signatures: Darreld Mclean (MD)   (Signed 25-Apr-14 19:28)  Co-Signer: PREGNANCY and LABOR, ADDITIONAL COMMENTS Shammara Jarrett (NP)   (Signed 24-Apr-14 21:17)  Authored: PREGNANCY and LABOR, ADDITIONAL COMMENTS  Last Updated: 25-Apr-14 19:28 by Darreld Mclean (MD)

## 2014-10-02 NOTE — H&P (Signed)
L&D Evaluation:  History Expanded:  HPI 29 yo G2P0010 whose EDC 10/25/12, presents at 36 6/7 weeks with c/o leaking fluid.  Pt's prenatal course has been followed at [facility], [facility] and [facility] in [place].  Pt has had multiple concerns with PTL, has recieved 2 seperate courses of beta methasone, several known large leiomyomas, s/p hysteroscopic myomectomy. Per [facility], pt is a candidate for vaginal delivery.   Gravida 2   Term 0   PreTerm 0   Abortion 1   Living 0   Blood Type (Maternal) A positive   Group B Strep Results Maternal (Result >5wks must be treated as unknown) negative   Maternal HIV Negative   Maternal Syphilis Ab Nonreactive   Maternal Varicella Immune   Rubella Results (Maternal) immune   EDC 25-Oct-2012   Presents with contractions   Patient's Medical History No Chronic Illness   Patient's Surgical History D&C  hysterosocpic myomectomy   Medications Pre Natal Vitamins   Allergies Sulfa, hives, non-paper tape   Social History none   Current Prenatal Course Notable For see above   Exam:  Vital Signs stable   General no apparent distress   Mental Status clear   Abdomen gravid, tender with contractions   Estimated Fetal Weight Average for gestational age   Pelvic 5/100/-2, unchanged after 1.5 hrs   Mebranes Intact   FHT normal rate with no decels   Ucx period of ctx q 2-3 min, spaced out to q 6-8 min   Impression:  Impression IUP at [redacted]w[redacted]d, advanced dilation, no cervical change   Plan:  Comments Discussed with pt indication for induction/augmentation of labor. Offered pain medication or medication to help her sleep, which she declines. Also offered to continue monitoring overnight, which she also declines.   Follow Up Appointment already scheduled. 5/8   Electronic Signatures: Ander Purpura (CNM)  (Signed 05-May-14 23:16)  Authored: L&D Evaluation   Last Updated: 05-May-14 23:16 by Ander Purpura (CNM)

## 2014-10-02 NOTE — H&P (Signed)
L&D Evaluation:  History Expanded:  HPI 29 yo G2P0010 whose [place] = June 3.  Pt [facility] ad[facility] for this pregnancy.  Preg complicated bu increasing leiomyomas.  "I have one posterior that is pushing up my cervix.  Pt is s/p hysteroscopic myomectomy.  She has already recieved beta methasone and got her second dose of 17P progesterone today. Pt has had low back pain for the last week.   Presents with back pain   Patient's Medical History No Chronic Illness   Patient's Surgical History Dilation and evacuation, hysteroscopic myomectomy   Medications Pre Natal Vitamins  Iron  Flagyl, Miralac, 17P, beta methasone   Allergies Sulfa, rash   Current Prenatal Course Notable For leiomyoa   Exam:  Vital Signs stable   General no apparent distress   Mental Status clear   Chest clear   Heart normal sinus rhythm   Abdomen gravid, non-tender   Estimated Fetal Weight Average for gestational age   Back no CVAT   Pelvic (WS) 2 cm, 80%   Mebranes Intact   FHT normal rate with no decels   Impression:  Impression Poss PTL   Plan:  Plan UA, monitor contractions and for cervical change   Electronic Signatures: Rosina Lowenstein (MD)  (Signed 27-Feb-14 16:54)  Authored: L&D Evaluation   Last Updated: 27-Feb-14 16:54 by Rosina Lowenstein (MD)

## 2014-10-02 NOTE — H&P (Signed)
L&D Evaluation:  History:  HPI 29 yo G2P0010 at [redacted]w[redacted]d gestational age by LMP consistent with early ultrasound.  Pregnancy complicated by multiple large uterine fibroids (8 and 7cm).  She also has as history of myomectomy that was submucosal, hysteroscopically.  She recently had an ultrasound that showed a cervical length of 75mm.  She has been referred to [facility] perinatal for management recommendations.  She has received a course of betamethasone which was complete this afternoon (48 hours from first dose).  She also received her first injection of 17OHPC two days ago.   She presented today with low back pain since yesterday.  She has also had consistent all-day rectal pressure and has had difficulty having a bowel movement. She denies vaginal bleeding and leakage of fluid. She notes positive fetal movement.  She denies fevers, urinary symptoms, vaginal symptoms.   A+/RI/VZI   Presents with back pain   Patient's Medical History fibroid uterus   Patient's Surgical History 1) birthmark excision, 2) D&C (SAB @ 8 weeks), 3) hysteroscopy with leiomyomectomy, 4) mole removal   Medications Pre Natal Vitamins   Allergies Sulfa, hives   Social History none   ROS:  ROS All systems were reviewed.  HEENT, CNS, GI, GU, Respiratory, CV, Renal and Musculoskeletal systems were found to be normal., unless noted in HPI   Exam:  Vital Signs stable   General no apparent distress   Mental Status clear   Chest clear   Heart normal sinus rhythm   Abdomen gravid, non-tender   Estimated Fetal Weight normal size for gestational age   Back no CVAT   Edema no edema   Pelvic no external lesions, SSE: cervix appears closed, SVE: closed/50/high   Mebranes Intact   FHT normal rate with no decels   FHT Description 140/mod var   Ucx irritability   Skin no lesions   Impression:  Impression dehydration, back pain, shortened cervix   Plan:  Plan UA, monitor contractions and for cervical change,  fluids   Comments 1) back pain: uterine irritability, UA not suggestive of etiology.  She is dehydrated with a high specifica gravity and she demonstrates uterine irritability.  Will hydrate her with LR 1 liter x 1.  Prior to discharge, her back pain has resolved. 2) Short cervix with risk of preterm delivery: fFN +, discussed these results with patient and that this test is much better as an indicator when it is negative.  Still a positive result is not reassuring.  her cervix is closed and she has received BMTZ and 17OHPC. Patient has been monitored and her contractions have slowed significantly. Dr. Laurey Morale has requested a [facility] consult for management recommendations.  Likely will continue progesterone, either IM or via vaginal suppository. 3)  BV: Will treat with 7 day course of flagyl.  She has received her initial dose here. 4) constipation: have recommended that she switch to miralax, 17grams once daily and discontinue docusate.  May need to consider an enema at some point. 5) Dispo: Home with strict precautions to return if her back pain returns.  She is given strict instructions and advice on how to hydrate herself and stay hydrated.   Follow Up Appointment already scheduled. in 5 days at [facility] in [place] clinic   Labs:  Lab Results:  BF Analysis:  22-Feb-14 20:03   Fetal Fibronectin (comp) POSITIVE-Fetal Fibronectin DETECTED.  Appearance NORMAL  Routine Micro:  22-Feb-14 20:03   Micro Text Report WET PREP   COMMENT  MODERATE WHITE BLOOD CELLS SEEN   COMMENT                   CLUE CELLS SEEN   COMMENT                   NO TRICHOMONAS SEEN   COMMENT                   NO SPERMATOZOA SEEN   COMMENT                   NO YEASTSEEN   ANTIBIOTIC                       Comment 2. CLUE CELLS SEEN  Comment 3. NO TRICHOMONAS SEEN  Comment 5. NO YEAST SEEN  Result(s) reported on 16 Jul 2012 at 08:56PM.  Routine UA:  22-Feb-14 20:03   Color (UA) Yellow  Clarity (UA)  Hazy  Glucose (UA) Negative  Bilirubin (UA) Negative  Ketones (UA) Trace  Specific Gravity (UA) 1.031  Blood (UA) Negative  pH (UA) 6.0  Protein (UA) 30 mg/dL  Nitrite (UA) Negative  Leukocyte Esterase (UA) Negative (Result(s) reported on 16 Jul 2012 at 09:07PM.)  RBC (UA) 2 /HPF  WBC (UA) 3 /HPF  Bacteria (UA) TRACE  Epithelial Cells (UA) 2 /HPF  Mucous (UA) PRESENT (Result(s) reported on 16 Jul 2012 at 09:07PM.)   Electronic Signatures: Will Bonnet (MD)  (Signed 23-Feb-14 00:44)  Authored: L&D Evaluation, Labs   Last Updated: 23-Feb-14 00:44 by Will Bonnet (MD)

## 2014-10-02 NOTE — H&P (Signed)
L&D Evaluation:  History Expanded:  HPI 29 yo G2P0010 whose [place] 10/25/12.  Pt's prenatalcourse to date has been very complicated, has been followed at [facility], [facility] and [facility] in [place].  Pt has had multiple concerns with PTL, has recieved 2 seperate courses of beta methasone. Prt has several known large leiomyomas, suipposedluy told at [facility] that they shouldn't interfer with a vaginal delivery.  Pt is also s/p hysteroscopic myomectomy and was told that this also should not interfer with a vaginal delivery,  Pt referred from [facility] today being 4 cm dilated (previously has been 2-3)   Gravida 3   Term 0   PreTerm 0   Abortion 1   Living 0   Blood Type (Maternal) A positive   Group B Strep Results Maternal (Result >5wks must be treated as unknown) unknown/result > 5 weeks ago   Maternal HIV Negative   Maternal Syphilis Ab Nonreactive   Maternal Varicella Immune   Rubella Results (Maternal) immune   EDC 25-Oct-2012   Presents with contractions   Patient's Medical History No Chronic Illness   Patient's Surgical History D&C  hystgerosocpic myomectomy   Medications Pre Natal Vitamins   Allergies Sulfa, hives   Social History none   Current Prenatal Course Notable For see above   Exam:  Vital Signs stable   General no apparent distress   Mental Status clear   Chest clear   Heart normal sinus rhythm   Abdomen gravid, non-tender   Estimated Fetal Weight Average for gestational age   Pelvic 4 cm   Mebranes Intact   FHT normal rate with no decels   Impression:  Impression PTL, Leiomyomas   Plan:  Comments I have had an exceedingly long and careful discussion with this patient and her hesband about all the risks that potentially are ahead for them. The risks of pretem labor, even with beta methasone The risks of the need for a Cesarean section, the risks associated with a Cesarean section in view of the leiomyomas The risks of a  ruptured uterus in view of the hysteroscopic myomectomy The risks of a postpartum hemorrhage adn the treatment of same up to and including the possibility of needing a hysterectomy any number of additional risks Will watch overnight, at this time if she goes into good labor I will not attempt to stop it.   Electronic Signatures: Rosina Lowenstein (MD)  (Signed 24-Apr-14 16:35)  Authored: L&D Evaluation   Last Updated: 24-Apr-14 16:35 by Rosina Lowenstein (MD)

## 2014-10-02 NOTE — H&P (Signed)
L&D Evaluation:  History:  HPI 29 yo G2P0010 whose EDC = June 3 presents to L&D at 27 weeks with c/o feeling like baby is kicking in my vagina, and an increased mucoid discharge, sometimes watery, clear to yellow in color.  Pt followed at both [facility] and [facility] for this pregnancy.  Preg complicated by large leiomyomas.  Pt is s/p hysteroscopic myomectomy.  She has already recieved beta methasone after her cx was found to be shortened with some funneling at 25 + weeks and and she was started on 17P progesterone weekly  at that time. Last week she was told she was dilated to 2cm/80% in the office and was sent to L&D for monitoring-but was not laboring at the time and was discharged on pelvic rest and modified bedrest. She denies feeling regular contractions or VB. No dysuria or vulvar itching.   Presents with Feels like baby kicking in my vagina. mucoid vaginal discharge   Patient's Medical History No Chronic Illness  Uterine fibroids   Patient's Surgical History Dilation and evacuation, hysteroscopic myomectomy   Medications Pre Natal Vitamins  Iron  Miralax,  17P. weekly (has received 2 injections)   Allergies Sulfa, rash   Social History none   Family History Non-Contributory   ROS:  ROS see HPI   Exam:  Vital Signs stable  130/73   Urine Protein negative dipstick, UA ess negative   General no apparent distress, appears anxious   Mental Status clear   Abdomen gravid, non-tender   Fetal Position footling breech on US   Pelvic 1.5/50%/OOP   Mebranes Intact   FHT normal rate with no decels, 140s with accels   FHT Description mod variability   Fetal Heart Rate 140   Ucx uterine irritability that resolved in 10-15 min after arrival, then acontractile   Skin dry   Other wet prep: negative for hyphae, Trich or clue cells   Impression:  Impression IUP at 27 with reactive NST, footling breech, not in labor.   Plan:  Plan Dc home with PTL precautions and follow  up at [facility]  3/10 and [facility] 3/14   Electronic Signatures: Karene Fry (CNM)  (Signed 04-Mar-14 17:57)  Authored: L&D Evaluation   Last Updated: 04-Mar-14 17:57 by Karene Fry (CNM)

## 2015-08-06 DIAGNOSIS — Z808 Family history of malignant neoplasm of other organs or systems: Secondary | ICD-10-CM | POA: Insufficient documentation

## 2016-08-07 ENCOUNTER — Encounter: Payer: Self-pay | Admitting: Obstetrics and Gynecology

## 2016-08-07 ENCOUNTER — Ambulatory Visit (INDEPENDENT_AMBULATORY_CARE_PROVIDER_SITE_OTHER): Payer: 59 | Admitting: Obstetrics and Gynecology

## 2016-08-07 VITALS — BP 112/76 | Ht 65.0 in | Wt 150.0 lb

## 2016-08-07 DIAGNOSIS — Z01419 Encounter for gynecological examination (general) (routine) without abnormal findings: Secondary | ICD-10-CM

## 2016-08-07 DIAGNOSIS — Z124 Encounter for screening for malignant neoplasm of cervix: Secondary | ICD-10-CM

## 2016-08-07 NOTE — Progress Notes (Signed)
Gynecology Annual Exam  PCP: [facility]  Chief Complaint:  Chief Complaint  Patient presents with  . Annual Exam    History of Present Illness:  Ms. Jaclyn Romero is a 31 y.o. G2P0020 who LMP was No LMP recorded. Patient is not currently having periods (Reason: IUD)., presents today for her annual examination.  Her menses are rare due to the IUD. Dysmenorrhea none. She does have intermenstrual bleeding.  She is single partner, contraception - IUD.  Last Pap: 1 year ago  Results were: Nilm /neg HPV DNA positive (note typed) Hx of STDs: none  Last mammogram: n/a There is no FH of breast cancer. There is no FH of ovarian cancer. The patient does do self-breast exams.  Tobacco use: The patient denies current or previous tobacco use. Alcohol use: none Exercise: very active  She does get adequate calcium and Vitamin D in her diet.  The patient wears seatbelts: yes.   The patient reports that domestic violence in her life is absent.     Review of Systems: Review of Systems  Constitutional: Negative.   HENT: Negative.   Eyes: Negative.   Respiratory: Negative.   Cardiovascular: Negative.   Gastrointestinal: Negative.   Genitourinary: Negative.   Musculoskeletal: Negative.   Skin: Negative.   Neurological: Negative.   Psychiatric/Behavioral: Negative.    Past Medical History:  Diagnosis Date  . Fibroids    Past Surgical History:  Procedure Laterality Date  . DILATION AND CURETTAGE OF UTERUS     miscarriage   Medications:   Medication Sig Start Date End Date Taking? Authorizing Provider  levonorgestrel (MIRENA) 20 MCG/24HR IUD by Intrauterine route.   Yes Historical Provider, MD    Allergies  Allergen Reactions  . Sulfa Antibiotics     Gynecologic History: No LMP recorded. Patient is not currently having periods (Reason: IUD).  Obstetric History: G2P1011  Social History   Social History  . Marital status: Married    Spouse name: N/A  . Number  of children: N/A  . Years of education: N/A   Occupational History  . Not on file.   Social History Main Topics  . Smoking status: Never Smoker  . Smokeless tobacco: Never Used  . Alcohol use No  . Drug use: No  . Sexual activity: Yes    Birth control/ protection: IUD   Other Topics Concern  . Not on file   Social History Narrative  . No narrative on file    Family History  Problem Relation Age of Onset  . Melanoma Mother      Physical Exam BP 112/76   Ht 5\' 5"  (1.651 m)   Wt 150 lb (68 kg)   BMI 24.96 kg/m   General: NAD HEENT: normocephalic, anicteric Thyroid: no enlargement, no palpable nodules Pulmonary: No increased work of breathing, CTAB Cardiovascular: RRR, distal pulses 2+ Breast: Breast symmetrical, no tenderness, no palpable nodules or masses, no skin or nipple retraction present, no nipple discharge.  No axillary or supraclavicular lymphadenopathy. Abdomen: NABS, soft, non-tender, non-distended.  Umbilicus without lesions.  No hepatomegaly, splenomegaly or masses palpable. No evidence of hernia  Genitourinary:  External: Normal external female genitalia.  Normal urethral meatus, normal  Bartholin's and Skene's glands.    Vagina: Normal vaginal mucosa, no evidence of prolapse.    Cervix: Grossly normal in appearance, no bleeding, IUD strings PRESENT  Uterus: mildly enlarged posteriorly, mobile, normal contour.  No CMT  Adnexa: ovaries non-enlarged, no adnexal masses  Rectal: deferred  Lymphatic: no evidence of inguinal lymphadenopathy Extremities: no edema, erythema, or tenderness Neurologic: Grossly intact Psychiatric: mood appropriate, affect full  Female chaperone present for pelvic and breast  portions of the physical exam  Results:  AUDIT alcohol questionnaire: 0 PHQ-9: 0   Assessment: 31 y.o. G2P0020 here for routine annual  Plan: 1. Women's annual routine gynecological examination a) Contraception: continue IUD.  2) STI screening was  offered and declined  3) Pap - ASCCP guidelines and rational discussed.  Patient opts for routine screening interval. Done today as above  4) Routine healthcare maintenance including cholesterol, diabetes screening not indicated  2. Pap smear for cervical cancer screening - IGP, Aptima HPV, rfx 16/18,45   Follow up 1 year for routine annual exam  Prentice Docker, MD 08/07/2016 9:15 AM

## 2016-08-11 ENCOUNTER — Encounter: Payer: Self-pay | Admitting: Obstetrics and Gynecology

## 2016-08-11 LAB — IGP, APTIMA HPV, RFX 16/18,45
HPV APTIMA: NEGATIVE
PAP Smear Comment: 0

## 2017-10-08 ENCOUNTER — Encounter: Payer: Self-pay | Admitting: Obstetrics and Gynecology

## 2017-10-08 ENCOUNTER — Ambulatory Visit (INDEPENDENT_AMBULATORY_CARE_PROVIDER_SITE_OTHER): Payer: BLUE CROSS/BLUE SHIELD | Admitting: Obstetrics and Gynecology

## 2017-10-08 VITALS — BP 102/72 | Ht 65.0 in | Wt 156.0 lb

## 2017-10-08 DIAGNOSIS — Z1331 Encounter for screening for depression: Secondary | ICD-10-CM | POA: Diagnosis not present

## 2017-10-08 DIAGNOSIS — Z1339 Encounter for screening examination for other mental health and behavioral disorders: Secondary | ICD-10-CM | POA: Diagnosis not present

## 2017-10-08 DIAGNOSIS — Z01419 Encounter for gynecological examination (general) (routine) without abnormal findings: Secondary | ICD-10-CM

## 2017-10-08 NOTE — Progress Notes (Signed)
Gynecology Annual Exam   PCP: [facility]  Chief Complaint  Patient presents with  . Gynecologic Exam    History of Present Illness:  Ms. Jaclyn Romero is a 32 y.o. G2P1011 who LMP was No LMP recorded. (Menstrual status: IUD)., presents today for her annual examination.  Her menses nearly absent due to her IUD (Placed 04/2013).  She is single partner, contraception - IUD.  Last Pap: August 07, 2016  Results were: no abnormalities /neg HPV DNA negative Hx of STDs: none  Last mammogram:  N/a due to age There is no FH of breast cancer. There is no FH of ovarian cancer. The patient does do self-breast exams.  Tobacco use: The patient denies current or previous tobacco use. Alcohol use: none Exercise: very active  The patient wears seatbelts: yes.      Past Medical History:  Diagnosis Date  . Fibroids     Past Surgical History:  Procedure Laterality Date  . DILATION AND CURETTAGE OF UTERUS     miscarriage    Prior to Admission medications   Medication Sig Start Date End Date Taking? Authorizing Provider  levonorgestrel (MIRENA) 20 MCG/24HR IUD by Intrauterine route.   Yes [provider]  loratadine (CLARITIN) 10 MG tablet Take 10 mg by mouth daily.   Yes [provider]  Multiple Vitamin (MULTIVITAMIN) tablet Take 1 tablet by mouth daily.   Yes [provider]    Allergies  Allergen Reactions  . Sulfa Antibiotics Hives    Gynecologic History: No LMP recorded. (Menstrual status: IUD).  Obstetric History: G2P1011  Social History   Socioeconomic History  . Marital status: Married    Spouse name: Not on file  . Number of children: Not on file  . Years of education: Not on file  . Highest education level: Not on file  Occupational History  . Not on file  Social Needs  . Financial resource strain: Not on file  . Food insecurity:    Worry: Not on file    Inability: Not on file  . Transportation needs:    Medical:  Not on file    Non-medical: Not on file  Tobacco Use  . Smoking status: Never Smoker  . Smokeless tobacco: Never Used  Substance and Sexual Activity  . Alcohol use: No  . Drug use: No  . Sexual activity: Yes    Birth control/protection: IUD  Lifestyle  . Physical activity:    Days per week: 4 days    Minutes per session: 30 min  . Stress: Not at all  Relationships  . Social connections:    Talks on phone: Not on file    Gets together: Not on file    Attends religious service: Not on file    Active member of club or organization: Not on file    Attends meetings of clubs or organizations: Not on file    Relationship status: Not on file  . Intimate partner violence:    Fear of current or ex partner: Not on file    Emotionally abused: Not on file    Physically abused: Not on file    Forced sexual activity: Not on file  Other Topics Concern  . Not on file  Social History Narrative  . Not on file    Family History  Problem Relation Age of Onset  . Melanoma Mother     Review of Systems  Constitutional: Negative.   HENT: Negative.   Eyes:  Negative.   Respiratory: Negative.   Cardiovascular: Negative.   Gastrointestinal: Negative.   Genitourinary: Negative.   Musculoskeletal: Negative.   Skin: Negative.   Neurological: Negative.   Psychiatric/Behavioral: Negative.      Physical Exam BP 102/72   Ht 5\' 5"  (1.651 m)   Wt 156 lb (70.8 kg)   BMI 25.96 kg/m    Physical Exam  Constitutional: She is oriented to person, place, and time. She appears well-developed and well-nourished. No distress.  Genitourinary: Uterus normal. Pelvic exam was performed with patient supine. There is no rash, tenderness, lesion or injury on the right labia. There is no rash, tenderness, lesion or injury on the left labia. No erythema, tenderness or bleeding in the vagina. No signs of injury around the vagina. No vaginal discharge found. Right adnexum does not display mass, does not display  tenderness and does not display fullness. Left adnexum does not display mass, does not display tenderness and does not display fullness.  Cervix exhibits visible IUD strings. Cervix does not exhibit motion tenderness, lesion, discharge or polyp.   Uterus is mobile and retroverted. Uterus is not enlarged, tender or exhibiting a mass.  HENT:  Head: Normocephalic and atraumatic.  Eyes: EOM are normal. No scleral icterus.  Neck: Normal range of motion. Neck supple. No thyromegaly present.  Cardiovascular: Normal rate and regular rhythm. Exam reveals no gallop and no friction rub.  No murmur heard. Pulmonary/Chest: Effort normal and breath sounds normal. No respiratory distress. She has no wheezes. She has no rales. Right breast exhibits no inverted nipple, no mass, no nipple discharge, no skin change and no tenderness. Left breast exhibits no inverted nipple, no mass, no nipple discharge, no skin change and no tenderness.  Abdominal: Soft. Bowel sounds are normal. She exhibits no distension and no mass. There is no tenderness. There is no rebound and no guarding.  Musculoskeletal: Normal range of motion. She exhibits no edema or tenderness.  Lymphadenopathy:    She has no cervical adenopathy.       Right: No inguinal adenopathy present.       Left: No inguinal adenopathy present.  Neurological: She is alert and oriented to person, place, and time. No cranial nerve deficit.  Skin: Skin is warm and dry. No rash noted. No erythema.  Psychiatric: She has a normal mood and affect. Her behavior is normal. Judgment normal.    Female chaperone present for pelvic and breast  portions of the physical exam  Results: AUDIT Questionnaire (screen for alcoholism): 0 PHQ-9: 0  Assessment: 32 y.o. G19P1011 female here for routine annual gynecologic examination.  Plan: Problem List Items Addressed This Visit    None    Visit Diagnoses    Women's annual routine gynecological examination    -  Primary    Screening for depression       Screening for alcoholism          Screening: -- Blood pressure screen normal -- Colonoscopy - not due -- Mammogram - not due -- Weight screening: normal -- Depression screening negative (PHQ-9) -- Nutrition: normal -- cholesterol screening: not due for screening -- osteoporosis screening: not due -- tobacco screening: not using -- alcohol screening: AUDIT questionnaire indicates low-risk usage. -- family history of breast cancer screening: done. not at high risk. -- no evidence of domestic violence or intimate partner violence. -- STD screening: gonorrhea/chlamydia NAAT not collected per patient request. -- pap smear not collected per ASCCP guidelines -- flu vaccine received --  HPV vaccination series: not eligilbe  Return in about 7 months (around 05/10/2018) for IUD removal and reinsertion (Mirena).   Prentice Docker, MD 10/08/2017 11:08 AM

## 2018-03-03 DIAGNOSIS — M7710 Lateral epicondylitis, unspecified elbow: Secondary | ICD-10-CM

## 2018-03-03 HISTORY — DX: Lateral epicondylitis, unspecified elbow: M77.10

## 2018-03-22 ENCOUNTER — Telehealth: Payer: Self-pay | Admitting: Obstetrics and Gynecology

## 2018-03-22 NOTE — Telephone Encounter (Signed)
Patient is schedule 04/29/18 with Dr. Glennon Mac for removal and reinsertion Mirena

## 2018-04-29 ENCOUNTER — Ambulatory Visit: Payer: BLUE CROSS/BLUE SHIELD | Admitting: Obstetrics and Gynecology

## 2018-04-29 ENCOUNTER — Encounter: Payer: Self-pay | Admitting: Obstetrics and Gynecology

## 2018-04-29 VITALS — BP 110/68 | Ht 65.0 in | Wt 160.0 lb

## 2018-04-29 DIAGNOSIS — Z30433 Encounter for removal and reinsertion of intrauterine contraceptive device: Secondary | ICD-10-CM

## 2018-04-29 NOTE — Telephone Encounter (Signed)
Mirena provided for this patient. 

## 2018-05-01 ENCOUNTER — Encounter: Payer: Self-pay | Admitting: Obstetrics and Gynecology

## 2018-05-01 NOTE — Progress Notes (Signed)
   IUD Removal  Patient identified, informed consent performed, consent signed.  Patient was in the dorsal lithotomy position, normal external genitalia was noted.  A speculum was placed in the patient's vagina, normal discharge was noted, no lesions. The cervix was visualized, no lesions, no abnormal discharge.  The strings of the IUD were grasped and pulled using ring forceps. The IUD was removed in its entirety. Patient tolerated the procedure well.    IUD Insertion Procedure Note Patient identified, informed consent performed, consent signed.   Discussed risks of irregular bleeding, cramping, infection, malpositioning, expulsion or uterine perforation of the IUD (1:1000 placements)  which may require further procedure such as laparoscopy.  IUD while effective at preventing pregnancy do not prevent transmission of sexually transmitted diseases and use of barrier methods for this purpose was discussed. Time out was performed.  Urine pregnancy test negative.  Speculum already placed in the vagina.  Cervix previously visualized.  Cleaned with Betadine x 2.  Grasped anteriorly with a single tooth tenaculum.  Uterus sounded to 8 cm. IUD placed per manufacturer's recommendations.  Strings trimmed to 3 cm. Tenaculum was removed, good hemostasis noted.  Patient tolerated procedure well.   Of note, an ultrasound was utilized to guide insertion, though the insertion was straightforward. This was done due to the fact that her prior insertion was difficult and require ultrasound guidance.  Patient was given post-procedure instructions.  She was advised to have backup contraception for one week.  Patient was also asked to check IUD strings periodically and follow up in 4 weeks for IUD check.  Prentice Docker, MD, [facility], [facility] 05/01/2018 7:36 PM

## 2018-05-26 ENCOUNTER — Ambulatory Visit: Payer: BLUE CROSS/BLUE SHIELD | Admitting: Obstetrics and Gynecology

## 2018-05-26 ENCOUNTER — Encounter: Payer: Self-pay | Admitting: Obstetrics and Gynecology

## 2018-05-26 VITALS — BP 124/78 | HR 65 | Wt 163.0 lb

## 2018-05-26 DIAGNOSIS — Z30431 Encounter for routine checking of intrauterine contraceptive device: Secondary | ICD-10-CM

## 2018-05-26 NOTE — Progress Notes (Signed)
IUD String Check  Subjctive: Jaclyn Romero presents for IUD string check.  She had a Mirena placed 4 weeks ago.  Since placement of her IUD she had minimal vaginal bleeding.  She denies cramping or discomfort.  She has had intercourse since placement.  She has not checked the strings.  She denies any fever, chills, nausea, vomiting, or other complaints.    Objective: BP 124/78   Pulse 65   Wt 163 lb (73.9 kg)   BMI 27.12 kg/m  Physical Exam Exam conducted with a chaperone present.  Constitutional:      General: She is not in acute distress.    Appearance: Normal appearance.  HENT:     Head: Normocephalic and atraumatic.  Eyes:     General: No scleral icterus.    Conjunctiva/sclera: Conjunctivae normal.  Abdominal:     Tenderness: There is no abdominal tenderness.     Hernia: There is no hernia in the right inguinal area or left inguinal area.  Genitourinary:    General: Normal vulva.     Exam position: Lithotomy position.     Pubic Area: No rash.      Labia:        Right: No rash, tenderness, lesion or injury.        Left: No rash, tenderness, lesion or injury.      Urethra: No prolapse, urethral pain or urethral lesion.     Vagina: Normal.     Cervix: No cervical motion tenderness, lesion or cervical bleeding.     Uterus: Normal. Not deviated and not tender.      Adnexa: Right adnexa normal and left adnexa normal.     Comments: IUD strings present at about 3 cm. Not trimmed. Musculoskeletal: Normal range of motion.        General: No swelling or tenderness.  Lymphadenopathy:     Lower Body: No right inguinal adenopathy. No left inguinal adenopathy.  Skin:    General: Skin is warm and dry.  Neurological:     General: No focal deficit present.     Mental Status: She is alert and oriented to person, place, and time.     Cranial Nerves: No cranial nerve deficit.  Psychiatric:        Mood and Affect: Mood normal.        Behavior: Behavior normal.        Thought  Content: Thought content normal.        Judgment: Judgment normal.    Female chaperone was present for the entirety of the pelvic exam  Assessment: 33 y.o. year old female status post prior Mirena IUD placement 4 week ago, doing well.  Plan: 1.  The patient was given instructions to check her IUD strings monthly and call with any problems or concerns.  She should call for fevers, chills, abnormal vaginal discharge, pelvic pain, or other complaints. 2.  She will return for a annual exam in 1 year.  All questions answered.  15 minutes spent in face to face discussion with > 50% spent in counseling, management, and coordination of care for her newly-placed IUD.  Risks and benefits of IUD discussed including the risks of irregular bleeding, cramping, infection, malpositioning, expulsion, which may require further procedures such as laparoscopy.  IUDs while effective at preventing pregnancy do not prevent transmission of sexually transmitted diseases and use of barrier methods for this purpose was discussed.  Low overall incidence of failure with 99.7% efficacy rate in typical use.  Prentice Docker, MD 05/26/2018 4:43 PM

## 2019-05-12 ENCOUNTER — Encounter: Payer: Self-pay | Admitting: Emergency Medicine

## 2019-05-12 ENCOUNTER — Other Ambulatory Visit: Payer: Self-pay

## 2019-05-12 ENCOUNTER — Emergency Department: Payer: BC Managed Care – PPO

## 2019-05-12 ENCOUNTER — Emergency Department
Admission: EM | Admit: 2019-05-12 | Discharge: 2019-05-12 | Disposition: A | Discharge status: Home / Self Care | Payer: BC Managed Care – PPO | Attending: Emergency Medicine | Admitting: Emergency Medicine

## 2019-05-12 DIAGNOSIS — K2100 Gastro-esophageal reflux disease with esophagitis, without bleeding: Secondary | ICD-10-CM | POA: Insufficient documentation

## 2019-05-12 DIAGNOSIS — R0789 Other chest pain: Secondary | ICD-10-CM | POA: Diagnosis not present

## 2019-05-12 DIAGNOSIS — Z79899 Other long term (current) drug therapy: Secondary | ICD-10-CM | POA: Insufficient documentation

## 2019-05-12 DIAGNOSIS — Z20828 Contact with and (suspected) exposure to other viral communicable diseases: Secondary | ICD-10-CM | POA: Diagnosis not present

## 2019-05-12 LAB — TROPONIN I (HIGH SENSITIVITY)
Troponin I (High Sensitivity): <2 ng/L (ref <18)
Troponin I (High Sensitivity): <2 ng/L (ref <18)

## 2019-05-12 LAB — BASIC METABOLIC PANEL
Anion gap: 9 (ref 5–15)
BUN: 20 mg/dL (ref 6–20)
CO2: 24 mmol/L (ref 22–32)
Calcium: 9.2 mg/dL (ref 8.9–10.3)
Chloride: 104 mmol/L (ref 98–111)
Creatinine, Ser: 0.91 mg/dL (ref 0.44–1.00)
GFR calc Af Amer: >60 mL/min (ref >60)
GFR calc non Af Amer: >60 mL/min (ref >60)
Glucose, Bld: 97 mg/dL (ref 70–99)
Potassium: 4.2 mmol/L (ref 3.5–5.1)
Sodium: 137 mmol/L (ref 135–145)

## 2019-05-12 LAB — CBC
HCT: 40.3 % (ref 36.0–46.0)
Hemoglobin: 13.7 g/dL (ref 12.0–15.0)
MCH: 31 pg (ref 26.0–34.0)
MCHC: 34 g/dL (ref 30.0–36.0)
MCV: 91.2 fL (ref 80.0–100.0)
Platelets: 226 10*3/uL (ref 150–400)
RBC: 4.42 MIL/uL (ref 3.87–5.11)
RDW: 12.3 % (ref 11.5–15.5)
WBC: 3.4 10*3/uL — ABNORMAL LOW (ref 4.0–10.5)
nRBC: 0 % (ref 0.0–0.2)

## 2019-05-12 NOTE — Discharge Instructions (Signed)
As we discussed, I suspect your symptoms are from acid reflux related to the pizza.  You can try to avoid spicy, tomato-based foods or foods high in acidity to see if this resolves your symptoms.  You can also consider over-the-counter antacids such as pepcid or omeprazole if symptoms do not improve.

## 2019-05-12 NOTE — ED Notes (Signed)
See triage note  Presents with slight cough last pm  Then had some intermittent slight chest pain      Then states she had an episode of becoming diaphoretic    Skin is warm and dry at present

## 2019-05-12 NOTE — ED Provider Notes (Signed)
[facility] Emergency Department Provider Note  ____________________________________________   First MD Initiated Contact with Patient 05/12/19 1238     (approximate)  I have reviewed the triage vital signs and the nursing notes.   HISTORY  Chief Complaint Chest Pain    HPI Jaclyn Romero is a 33 y.o. female here with transient cough and chest pain, now resolved.  The patient states that her symptoms started last night.  She had pizza for dinner.  She went to bed, and upon lying flat, noticed a dry, hacking type cough without any production of sputum.  She had no shortness of breath with it.  She said throughout the night.  She had some mild cough this morning, then went to work.  While at work, she developed a burning, epigastric pain.  There is no radiation.  No associated shortness of breath or vomiting.  She did have some mild nausea.  She told her supervisor who told her to come to the ED.  No fever.  No chills.  No personal history of coronary disease.  Her father does have coronary disease at a later age, otherwise denies any significant risk factors.  She does not smoke.  Pain is been constant for several hours prior to arrival.  No other complaints. Pain not worse w/ movement or deep inspiration.       Past Medical History:  Diagnosis Date  . Fibroids   . Tennis elbow 03/03/2018    Patient Active Problem List   Diagnosis Date Noted  . Family history of melanoma 08/06/2015    Past Surgical History:  Procedure Laterality Date  . DILATION AND CURETTAGE OF UTERUS     miscarriage    Prior to Admission medications   Medication Sig Start Date End Date Taking? Authorizing Provider  levonorgestrel (MIRENA) 20 MCG/24HR IUD by Intrauterine route.    [provider]  loratadine (CLARITIN) 10 MG tablet Take 10 mg by mouth daily.    [provider]  Multiple Vitamin (MULTIVITAMIN) tablet Take 1 tablet by mouth daily.    [provider]    Allergies Sulfa antibiotics  Family History  Problem Relation Age of Onset  . Melanoma Mother     Social History Social History   Tobacco Use  . Smoking status: Never Smoker  . Smokeless tobacco: Never Used  Substance Use Topics  . Alcohol use: No  . Drug use: No    Review of Systems  Review of Systems  Constitutional: Negative for chills and fever.  HENT: Negative for sore throat.   Respiratory: Positive for cough and chest tightness. Negative for shortness of breath.   Cardiovascular: Negative for chest pain.  Gastrointestinal: Positive for nausea. Negative for abdominal pain.  Genitourinary: Negative for flank pain.  Musculoskeletal: Negative for neck pain.  Skin: Negative for rash and wound.  Allergic/Immunologic: Negative for immunocompromised state.  Neurological: Negative for weakness and numbness.  Hematological: Does not bruise/bleed easily.     ____________________________________________  PHYSICAL EXAM:      VITAL SIGNS: ED Triage Vitals  Enc Vitals Group     BP 05/12/19 1108 114/77     Pulse Rate 05/12/19 1108 60     Resp 05/12/19 1108 16     Temp 05/12/19 1108 98.4 F (36.9 C)     Temp Source 05/12/19 1108 Oral     SpO2 05/12/19 1108 100 %     Weight 05/12/19 1123 150 lb (68 kg)  Height 05/12/19 1123 5\' 5"  (1.651 m)     Head Circumference --      Peak Flow --      Pain Score 05/12/19 1123 3     Pain Loc --      Pain Edu? --      Excl. in [place]? --      Physical Exam Vitals and nursing note reviewed.  Constitutional:      General: She is not in acute distress.    Appearance: She is well-developed.  HENT:     Head: Normocephalic and atraumatic.  Eyes:     Conjunctiva/sclera: Conjunctivae normal.  Cardiovascular:     Rate and Rhythm: Normal rate and regular rhythm.     Heart sounds: Normal heart sounds.  Pulmonary:     Effort: Pulmonary effort is normal. No respiratory distress.     Breath sounds: Normal breath  sounds. No wheezing or rhonchi.  Chest:     Chest wall: No tenderness.  Abdominal:     General: Bowel sounds are normal. There is no distension.     Palpations: Abdomen is soft.     Tenderness: There is no guarding or rebound.  Musculoskeletal:     Cervical back: Neck supple.  Skin:    General: Skin is warm.     Capillary Refill: Capillary refill takes less than 2 seconds.     Findings: No rash.  Neurological:     Mental Status: She is alert and oriented to person, place, and time.     Motor: No abnormal muscle tone.       ____________________________________________   LABS (all labs ordered are listed, but only abnormal results are displayed)  Labs Reviewed  CBC - Abnormal; Notable for the following components:      Result Value   WBC 3.4 (*)    All other components within normal limits  NOVEL CORONAVIRUS, NAA (HOSP ORDER, SEND-OUT TO REF LAB; TAT 18-24 HRS)  BASIC METABOLIC PANEL  TROPONIN I (HIGH SENSITIVITY)  TROPONIN I (HIGH SENSITIVITY)    ____________________________________________  EKG: Normal sinus rhythm, ventricular rate 63.  PR 72, QTc 448. ________________________________________  RADIOLOGY All imaging, including plain films, CT scans, and ultrasounds, independently reviewed by me, and interpretations confirmed via formal radiology reads.  ED MD interpretation:   CXR: Clear  Official radiology report(s): DG Chest 2 View  Result Date: 05/12/2019 CLINICAL DATA:  Onset chest pain this morning. EXAM: CHEST - 2 VIEW COMPARISON:  None. FINDINGS: Lungs clear. Heart size normal. No pneumothorax or pleural effusion. No acute bony abnormality. S shaped thoracolumbar scoliosis is partially imaged. IMPRESSION: No acute disease. Electronically Signed   By: Inge Rise M.D.   On: 05/12/2019 11:53    ____________________________________________  PROCEDURES   Procedure(s) performed (including Critical Care):  Procedures   ____________________________________________  INITIAL IMPRESSION / MDM / [place] / ED COURSE  As part of my medical decision making, I reviewed the following data within the [facility] notes reviewed and incorporated, Old chart reviewed, Notes from prior ED visits, and [place] Controlled Substance Database       *Jaclyn Romero was evaluated in Emergency Department on 05/12/2019 for the symptoms described in the history of present illness. She was evaluated in the context of the global COVID-19 pandemic, which necessitated consideration that the patient might be at risk for infection with the SARS-CoV-2 virus that causes COVID-19. Institutional protocols and algorithms that pertain to the evaluation of patients at  risk for COVID-19 are in a state of rapid change based on information released by regulatory bodies including the CDC and federal and state organizations. These policies and algorithms were followed during the patient's care in the ED.  Some ED evaluations and interventions may be delayed as a result of limited staffing during the pandemic.*  Clinical Course as of May 12 1439  Fri May 12, 2019  1322 33 yo F here with atypical CP. Pain began after eating pizza, was associated w/ lying flat, w/ burning sensation most c/w acid reflux or Gi etiology. EKG does have some nonspecific TW changes and QW in V2 but trop neg x 2 despite constant pain, making acute ischemia highly unlikely. Doubt ACS. Pain not c/w PE or dissection. Abdomen soft, Nt, Nd. Will d/c with antacids PRN, outpt follow-up.   [CI]    Clinical Course User Index [CI] Duffy Bruce, MD    Medical Decision Making:  As above. Very pleasant female here w/ atypical, most likely GI Cp. Trop neg x 2 with low-risk HEART score <3.  ____________________________________________  FINAL CLINICAL IMPRESSION(S) / ED DIAGNOSES  Final diagnoses:  Atypical chest pain  Gastroesophageal reflux disease with  esophagitis without hemorrhage     MEDICATIONS GIVEN DURING THIS VISIT:  Medications - No data to display   ED Discharge Orders    None       Note:  This document was prepared using Dragon voice recognition software and may include unintentional dictation errors.   Duffy Bruce, MD 05/12/19 1440

## 2019-05-12 NOTE — ED Triage Notes (Signed)
Pt here for central chest pain that started early this AM.  Does not radiate.  No SHOB or fever. Did have a cough last night. Unlabored at this time. VSS.

## 2019-05-13 LAB — NOVEL CORONAVIRUS, NAA (HOSP ORDER, SEND-OUT TO REF LAB; TAT 18-24 HRS): SARS-CoV-2, NAA: NOT DETECTED

## 2019-07-06 DIAGNOSIS — L700 Acne vulgaris: Secondary | ICD-10-CM | POA: Diagnosis not present

## 2019-07-06 DIAGNOSIS — L578 Other skin changes due to chronic exposure to nonionizing radiation: Secondary | ICD-10-CM | POA: Diagnosis not present

## 2019-07-06 DIAGNOSIS — Z86018 Personal history of other benign neoplasm: Secondary | ICD-10-CM | POA: Diagnosis not present

## 2019-07-11 NOTE — Telephone Encounter (Signed)
Mirena rcvd/charged 04/29/18

## 2019-08-01 DIAGNOSIS — L301 Dyshidrosis [pompholyx]: Secondary | ICD-10-CM | POA: Diagnosis not present

## 2019-08-01 DIAGNOSIS — G5621 Lesion of ulnar nerve, right upper limb: Secondary | ICD-10-CM | POA: Diagnosis not present

## 2019-08-01 DIAGNOSIS — M25449 Effusion, unspecified hand: Secondary | ICD-10-CM | POA: Diagnosis not present

## 2019-08-01 DIAGNOSIS — I7300 Raynaud's syndrome without gangrene: Secondary | ICD-10-CM | POA: Diagnosis not present

## 2019-08-16 DIAGNOSIS — E519 Thiamine deficiency, unspecified: Secondary | ICD-10-CM | POA: Diagnosis not present

## 2019-08-16 DIAGNOSIS — E538 Deficiency of other specified B group vitamins: Secondary | ICD-10-CM | POA: Diagnosis not present

## 2019-08-16 DIAGNOSIS — G5621 Lesion of ulnar nerve, right upper limb: Secondary | ICD-10-CM | POA: Diagnosis not present

## 2019-08-16 DIAGNOSIS — R202 Paresthesia of skin: Secondary | ICD-10-CM | POA: Diagnosis not present

## 2019-08-16 DIAGNOSIS — Z131 Encounter for screening for diabetes mellitus: Secondary | ICD-10-CM | POA: Diagnosis not present

## 2019-08-16 DIAGNOSIS — E559 Vitamin D deficiency, unspecified: Secondary | ICD-10-CM | POA: Diagnosis not present

## 2019-08-16 DIAGNOSIS — Q998 Other specified chromosome abnormalities: Secondary | ICD-10-CM | POA: Diagnosis not present

## 2019-08-16 DIAGNOSIS — E038 Other specified hypothyroidism: Secondary | ICD-10-CM | POA: Diagnosis not present

## 2019-08-30 DIAGNOSIS — L700 Acne vulgaris: Secondary | ICD-10-CM | POA: Diagnosis not present

## 2019-09-01 DIAGNOSIS — M7989 Other specified soft tissue disorders: Secondary | ICD-10-CM | POA: Diagnosis not present

## 2019-09-01 DIAGNOSIS — I7300 Raynaud's syndrome without gangrene: Secondary | ICD-10-CM | POA: Diagnosis not present

## 2019-09-01 DIAGNOSIS — M79641 Pain in right hand: Secondary | ICD-10-CM | POA: Diagnosis not present

## 2019-09-01 DIAGNOSIS — M79642 Pain in left hand: Secondary | ICD-10-CM | POA: Diagnosis not present

## 2019-09-12 DIAGNOSIS — R202 Paresthesia of skin: Secondary | ICD-10-CM | POA: Diagnosis not present

## 2019-09-12 DIAGNOSIS — M79641 Pain in right hand: Secondary | ICD-10-CM | POA: Diagnosis not present

## 2019-09-12 DIAGNOSIS — R200 Anesthesia of skin: Secondary | ICD-10-CM | POA: Diagnosis not present

## 2019-09-12 DIAGNOSIS — R29898 Other symptoms and signs involving the musculoskeletal system: Secondary | ICD-10-CM | POA: Diagnosis not present

## 2019-10-27 DIAGNOSIS — G5601 Carpal tunnel syndrome, right upper limb: Secondary | ICD-10-CM | POA: Diagnosis not present

## 2019-10-27 DIAGNOSIS — R200 Anesthesia of skin: Secondary | ICD-10-CM | POA: Diagnosis not present

## 2019-10-27 DIAGNOSIS — R202 Paresthesia of skin: Secondary | ICD-10-CM | POA: Diagnosis not present

## 2019-12-25 DIAGNOSIS — S92511A Displaced fracture of proximal phalanx of right lesser toe(s), initial encounter for closed fracture: Secondary | ICD-10-CM | POA: Diagnosis not present

## 2019-12-25 DIAGNOSIS — M25571 Pain in right ankle and joints of right foot: Secondary | ICD-10-CM | POA: Diagnosis not present

## 2019-12-25 DIAGNOSIS — W2203XA Walked into furniture, initial encounter: Secondary | ICD-10-CM | POA: Diagnosis not present

## 2019-12-25 DIAGNOSIS — S92911A Unspecified fracture of right toe(s), initial encounter for closed fracture: Secondary | ICD-10-CM | POA: Diagnosis not present

## 2019-12-25 DIAGNOSIS — S92401A Displaced unspecified fracture of right great toe, initial encounter for closed fracture: Secondary | ICD-10-CM | POA: Diagnosis not present

## 2019-12-28 DIAGNOSIS — S92514A Nondisplaced fracture of proximal phalanx of right lesser toe(s), initial encounter for closed fracture: Secondary | ICD-10-CM | POA: Diagnosis not present

## 2020-01-31 ENCOUNTER — Encounter: Payer: Self-pay | Admitting: Obstetrics and Gynecology

## 2020-01-31 ENCOUNTER — Other Ambulatory Visit: Payer: Self-pay

## 2020-01-31 ENCOUNTER — Ambulatory Visit (INDEPENDENT_AMBULATORY_CARE_PROVIDER_SITE_OTHER): Payer: BC Managed Care – PPO | Admitting: Obstetrics and Gynecology

## 2020-01-31 VITALS — BP 106/69 | HR 66 | Ht 64.0 in | Wt 147.0 lb

## 2020-01-31 DIAGNOSIS — Z1331 Encounter for screening for depression: Secondary | ICD-10-CM | POA: Diagnosis not present

## 2020-01-31 DIAGNOSIS — Z01419 Encounter for gynecological examination (general) (routine) without abnormal findings: Secondary | ICD-10-CM

## 2020-01-31 DIAGNOSIS — Z1339 Encounter for screening examination for other mental health and behavioral disorders: Secondary | ICD-10-CM | POA: Diagnosis not present

## 2020-01-31 NOTE — Progress Notes (Signed)
Gynecology Annual Exam  PCP: [facility]  Chief Complaint  Patient presents with  . Gynecologic Exam    had cycle in August, not normal on IUD. Breast tenderness   History of Present Illness:  Ms. Jaclyn Romero is a 34 y.o. G2P1011 who LMP was Patient's last menstrual period was 01/02/2020., presents today for her annual examination.  She had a period last month. It was bright red and lasted about 3-4 days.  She states that it wasn't heavy and she had to wear a pad. She had some minor cramping.    Her Mirena IUD was placed in 04/2018.    She is sexually active.  Denies pain with intercourse. .  Last Pap: 08/08/2018  Results were: no abnormalities /neg HPV DNA negative Hx of STDs: none  There is no FH of breast cancer. There is no FH of ovarian cancer. The patient does not do self-breast exams.  Tobacco use: The patient denies current or previous tobacco use. Alcohol use: none Exercise: very active (1-1.5 hours each day)  The patient wears seatbelts: yes.   The patient reports that domestic violence in her life is absent.   Past Medical History:  Diagnosis Date  . Carpal tunnel syndrome of right wrist   . Fibroids   . Tennis elbow 03/03/2018    Past Surgical History:  Procedure Laterality Date  . DILATION AND CURETTAGE OF UTERUS     miscarriage    Prior to Admission medications   Medication Sig Start Date End Date Taking? Authorizing Provider  EPIDUO FORTE 0.3-2.5 % GEL Apply 1 application topically at bedtime. 12/05/19  Yes [provider]  levonorgestrel (MIRENA) 20 MCG/24HR IUD by Intrauterine route.   Yes [provider]  loratadine (CLARITIN) 10 MG tablet Take 10 mg by mouth daily.   Yes [provider]  Multiple Vitamin (MULTIVITAMIN) tablet Take 1 tablet by mouth daily.   Yes [provider]  spironolactone (ALDACTONE) 100 MG tablet  01/10/20  Yes [provider]    Allergies  Allergen Reactions  . Sulfa  Antibiotics Hives    Obstetric History: G2P1011  Social History   Socioeconomic History  . Marital status: Married    Spouse name: Not on file  . Number of children: Not on file  . Years of education: Not on file  . Highest education level: Not on file  Occupational History  . Not on file  Tobacco Use  . Smoking status: Never Smoker  . Smokeless tobacco: Never Used  Vaping Use  . Vaping Use: Never used  Substance and Sexual Activity  . Alcohol use: No  . Drug use: No  . Sexual activity: Yes    Birth control/protection: I.U.D.  Other Topics Concern  . Not on file  Social History Narrative  . Not on file   Social Determinants of Health   Financial Resource Strain:   . Difficulty of Paying Living Expenses: Not on file  Food Insecurity:   . Worried About Charity fundraiser in the Last Year: Not on file  . Ran Out of Food in the Last Year: Not on file  Transportation Needs:   . Lack of Transportation (Medical): Not on file  . Lack of Transportation (Non-Medical): Not on file  Physical Activity:   . Days of Exercise per Week: Not on file  . Minutes of Exercise per Session: Not on file  Stress:   . Feeling of Stress : Not on file  Social  Connections:   . Frequency of Communication with Friends and Family: Not on file  . Frequency of Social Gatherings with Friends and Family: Not on file  . Attends Religious Services: Not on file  . Active Member of Clubs or Organizations: Not on file  . Attends Archivist Meetings: Not on file  . Marital Status: Not on file  Intimate Partner Violence:   . Fear of Current or Ex-Partner: Not on file  . Emotionally Abused: Not on file  . Physically Abused: Not on file  . Sexually Abused: Not on file    Family History  Problem Relation Age of Onset  . Melanoma Mother     Review of Systems  Constitutional: Negative.   HENT: Negative.   Eyes: Negative.   Respiratory: Negative.   Cardiovascular: Negative.    Gastrointestinal: Negative.   Genitourinary: Negative.   Musculoskeletal: Negative.        Joint swelling  Skin: Negative.        Breast tenderness  Neurological: Positive for tingling. Negative for dizziness, tremors, sensory change, speech change, focal weakness, seizures, loss of consciousness, weakness and headaches.  Psychiatric/Behavioral: Negative.      Physical Exam BP 106/69   Pulse 66   Ht 5\' 4"  (1.626 m)   Wt 147 lb (66.7 kg)   LMP 01/02/2020   BMI 25.23 kg/m    Physical Exam Constitutional:      General: She is not in acute distress.    Appearance: Normal appearance. She is well-developed.  Genitourinary:     Pelvic exam was performed with patient in the lithotomy position.     Vulva, urethra and bladder normal.     No inguinal adenopathy present in the right or left side.    No signs of injury in the vagina.     No vaginal discharge, erythema, tenderness or bleeding.     No cervical motion tenderness, discharge, lesion or polyp.     IUD strings visualized.     Uterus is enlarged (posterior fullness) and mobile.     Uterus is not tender.     No uterine mass detected.    Uterus is retroverted.     No right or left adnexal mass present.     Right adnexa not tender or full.     Left adnexa not tender or full.  HENT:     Head: Normocephalic and atraumatic.  Eyes:     General: No scleral icterus.    Conjunctiva/sclera: Conjunctivae normal.  Neck:     Thyroid: No thyromegaly.  Cardiovascular:     Rate and Rhythm: Normal rate and regular rhythm.     Heart sounds: No murmur heard.  No friction rub. No gallop.   Pulmonary:     Effort: Pulmonary effort is normal. No respiratory distress.     Breath sounds: Normal breath sounds. No wheezing or rales.  Chest:     Breasts:        Right: No inverted nipple, mass, nipple discharge, skin change or tenderness.        Left: No inverted nipple, mass, nipple discharge, skin change or tenderness.  Abdominal:      General: Bowel sounds are normal. There is no distension.     Palpations: Abdomen is soft. There is no mass.     Tenderness: There is no abdominal tenderness. There is no guarding or rebound.  Musculoskeletal:        General: No swelling or tenderness. Normal range  of motion.     Cervical back: Normal range of motion and neck supple.  Lymphadenopathy:     Cervical: No cervical adenopathy.     Lower Body: No right inguinal adenopathy. No left inguinal adenopathy.  Neurological:     General: No focal deficit present.     Mental Status: She is alert and oriented to person, place, and time.     Cranial Nerves: No cranial nerve deficit.  Skin:    General: Skin is warm and dry.     Findings: No erythema or rash.  Psychiatric:        Mood and Affect: Mood normal.        Behavior: Behavior normal.        Judgment: Judgment normal.     Female chaperone present for pelvic and breast  portions of the physical exam  Results: AUDIT Questionnaire (screen for alcoholism): 0 PHQ-9: 0   Assessment: 34 y.o. G70P1011 female here for routine annual gynecologic examination  Plan: Problem List Items Addressed This Visit    None    Visit Diagnoses    Women's annual routine gynecological examination    -  Primary   Screening for depression       Screening for alcoholism         Screening: -- Blood pressure screen normal -- Weight screening: normal -- Depression screening negative (PHQ-9) -- Nutrition: normal -- cholesterol screening: not due for screening -- osteoporosis screening: not due -- tobacco screening: not using -- alcohol screening: AUDIT questionnaire indicates low-risk usage. -- family history of breast cancer screening: done. not at high risk. -- no evidence of domestic violence or intimate partner violence. -- STD screening: gonorrhea/chlamydia NAAT not collected per patient request. -- pap smear not collected per ASCCP guidelines -- s/p COVID19 Levan Hurst, 2/27 and  08/19/19)  Prentice Docker, MD 01/31/2020 9:08 AM

## 2020-02-29 ENCOUNTER — Other Ambulatory Visit: Payer: BC Managed Care – PPO

## 2020-02-29 DIAGNOSIS — Z20822 Contact with and (suspected) exposure to covid-19: Secondary | ICD-10-CM

## 2020-03-01 LAB — NOVEL CORONAVIRUS, NAA: SARS-CoV-2, NAA: NOT DETECTED

## 2020-03-01 LAB — SARS-COV-2, NAA 2 DAY TAT

## 2020-07-08 DIAGNOSIS — Z86018 Personal history of other benign neoplasm: Secondary | ICD-10-CM | POA: Diagnosis not present

## 2020-07-08 DIAGNOSIS — L578 Other skin changes due to chronic exposure to nonionizing radiation: Secondary | ICD-10-CM | POA: Diagnosis not present

## 2021-01-22 ENCOUNTER — Ambulatory Visit
Admission: EM | Admit: 2021-01-22 | Discharge: 2021-01-22 | Disposition: A | Discharge status: Home / Self Care | Payer: BC Managed Care – PPO | Attending: Sports Medicine | Admitting: Sports Medicine

## 2021-01-22 ENCOUNTER — Other Ambulatory Visit: Payer: Self-pay

## 2021-01-22 DIAGNOSIS — S61216A Laceration without foreign body of right little finger without damage to nail, initial encounter: Secondary | ICD-10-CM

## 2021-01-22 DIAGNOSIS — Z23 Encounter for immunization: Secondary | ICD-10-CM

## 2021-01-22 DIAGNOSIS — S6990XA Unspecified injury of unspecified wrist, hand and finger(s), initial encounter: Secondary | ICD-10-CM

## 2021-01-22 MED ORDER — TETANUS-DIPHTH-ACELL PERTUSSIS 5-2.5-18.5 LF-MCG/0.5 IM SUSY
0.5000 mL | PREFILLED_SYRINGE | Freq: Once | INTRAMUSCULAR | Status: AC
  Administered 2021-01-22: 0.5 mL via INTRAMUSCULAR

## 2021-01-22 NOTE — Discharge Instructions (Addendum)
As we discussed, we used Dermabond to adhere the laceration.  Please keep the area clean and dry until you start to see it healing underneath the glue.  Given the position were going to put you in a finger splint to limit movement at that joint and optimize healing. Please see educational handouts. If you develop any fever redness or warmth then please come back here or go to your primary care provider.  If it is after hours and go to the ER as this may be a sign of an infection. Over-the-counter meds as needed for any discomfort.

## 2021-01-22 NOTE — ED Provider Notes (Signed)
[facility]    CSN: [id number] Arrival date & time: 01/22/21  1920      History   Chief Complaint Chief Complaint  Patient presents with   Laceration    R pinky finger    HPI Cj Platek is a 35 y.o. female.   Pleasant 35 year old left-hand-dominant female who presents for evaluation of a injury to her right fifth finger.  It occurred just prior to arrival.  She was pushing down the trash at her house and there was an aluminum can in the trash and she sustained a laceration to the palmar aspect of the fifth finger over the middle phalanx.  She thinks her tetanus shot was last administered about 8 years ago.  No other issues or problems are offered.   Past Medical History:  Diagnosis Date   Carpal tunnel syndrome of right wrist    Fibroids    Tennis elbow 03/03/2018    Patient Active Problem List   Diagnosis Date Noted   Family history of melanoma 08/06/2015    Past Surgical History:  Procedure Laterality Date   DILATION AND CURETTAGE OF UTERUS     miscarriage    OB History     Gravida  2   Para  1   Term  1   Preterm      AB  1   Living  1      SAB  1   IAB      Ectopic      Multiple      Live Births  1            Home Medications    Prior to Admission medications   Medication Sig Start Date End Date Taking? Authorizing Provider  levonorgestrel (MIRENA) 20 MCG/24HR IUD by Intrauterine route.   Yes [provider]  Multiple Vitamin (MULTIVITAMIN) tablet Take 1 tablet by mouth daily.   Yes [provider]  EPIDUO FORTE 0.3-2.5 % GEL Apply 1 application topically at bedtime. 12/05/19   [provider]  loratadine (CLARITIN) 10 MG tablet Take 10 mg by mouth daily.    [provider]  spironolactone (ALDACTONE) 100 MG tablet  01/10/20   [provider]    Family History Family History  Problem Relation Age of Onset   Melanoma Mother     Social History Social History    Tobacco Use   Smoking status: Never   Smokeless tobacco: Never  Vaping Use   Vaping Use: Never used  Substance Use Topics   Alcohol use: No   Drug use: No     Allergies   Sulfa antibiotics   Review of Systems Review of Systems  Constitutional:  Positive for activity change. Negative for appetite change, chills, diaphoresis, fatigue and fever.  HENT:  Negative for congestion, ear pain, postnasal drip, rhinorrhea, sinus pressure, sinus pain, sneezing and sore throat.   Eyes:  Negative for pain.  Respiratory:  Negative for cough, chest tightness and shortness of breath.   Cardiovascular:  Negative for chest pain and palpitations.  Gastrointestinal:  Negative for abdominal pain, diarrhea, nausea and vomiting.  Genitourinary:  Negative for dysuria.  Musculoskeletal:  Positive for arthralgias. Negative for back pain, myalgias and neck pain.  Skin:  Positive for wound. Negative for color change, pallor and rash.  Neurological:  Negative for dizziness, light-headedness, numbness and headaches.  All other systems reviewed and are negative.   Physical Exam Triage Vital Signs ED Triage Vitals  Enc  Vitals Group     BP 01/22/21 1940 117/82     Pulse Rate 01/22/21 1940 67     Resp 01/22/21 1940 18     Temp 01/22/21 1940 98.6 F (37 C)     Temp Source 01/22/21 1940 Oral     SpO2 01/22/21 1940 100 %     Weight 01/22/21 1939 146 lb (66.2 kg)     Height 01/22/21 1939 '5\' 5"'$  (1.651 m)     Head Circumference --      [place] Flow --      Pain Score 01/22/21 1938 7     Pain Loc --      Pain Edu? --      Excl. in [place]? --    No data found.  Updated Vital Signs BP 117/82 (BP Location: Left Arm)   Pulse 67   Temp 98.6 F (37 C) (Oral)   Resp 18   Ht '5\' 5"'$  (1.651 m)   Wt 66.2 kg   LMP 01/21/2021   SpO2 100%   BMI 24.30 kg/m   Visual Acuity Right Eye Distance:   Left Eye Distance:   Bilateral Distance:    Right Eye Near:   Left Eye Near:    Bilateral Near:     Physical  Exam Vitals and nursing note reviewed.  Constitutional:      General: She is not in acute distress.    Appearance: Normal appearance. She is not ill-appearing, toxic-appearing or diaphoretic.  HENT:     Head: Normocephalic and atraumatic.     Nose: Nose normal.     Mouth/Throat:     Mouth: Mucous membranes are moist.  Eyes:     Conjunctiva/sclera: Conjunctivae normal.     Pupils: Pupils are equal, round, and reactive to light.  Cardiovascular:     Rate and Rhythm: Normal rate and regular rhythm.     Pulses: Normal pulses.     Heart sounds: Normal heart sounds. No murmur heard.   No friction rub. No gallop.  Pulmonary:     Effort: Pulmonary effort is normal.     Breath sounds: Normal breath sounds. No stridor. No wheezing, rhonchi or rales.  Musculoskeletal:     Cervical back: Normal range of motion and neck supple.  Skin:    General: Skin is warm and dry.     Capillary Refill: Capillary refill takes less than 2 seconds.     Coloration: Skin is not jaundiced.     Findings: Signs of injury and laceration present. No abscess, ecchymosis or rash.     Comments: Patient has a 1 cm laceration over the middle phalanx of the right fifth finger.  It does not involve the DIP or PIP joints.  That said with bending of that finger it does cause the opening at the laceration to bleed.  Flexor and extensor tendons are intact.  There is no ligamentous laxity.  The remainder of the hand exam is within normal limits.  Neurological:     General: No focal deficit present.     Mental Status: She is alert and oriented to person, place, and time.     UC Treatments / Results  Labs (all labs ordered are listed, but only abnormal results are displayed) Labs Reviewed - No data to display  EKG   Radiology No results found.  Procedures Laceration Repair  Date/Time: 01/22/2021 8:04 PM Performed by: Verda Cumins, MD Authorized by: Verda Cumins, MD   Consent:    Consent  obtained:  Verbal    Consent given by:  Patient   Risks, benefits, and alternatives were discussed: yes     Risks discussed:  Infection, pain, poor cosmetic result, poor wound healing and need for additional repair   Alternatives discussed:  No treatment Universal protocol:    Procedure explained and questions answered to patient or proxy's satisfaction: yes     Immediately prior to procedure, a time out was called: yes     Patient identity confirmed:  Verbally with patient and arm band Anesthesia:    Anesthesia method:  None Laceration details:    Location:  Finger   Finger location:  R small finger   Length (cm):  1   Depth (mm):  1 Pre-procedure details:    Preparation:  Patient was prepped and draped in usual sterile fashion Exploration:    Limited defect created (wound extended): no     Hemostasis achieved with:  Direct pressure   Imaging outcome: foreign body not noted     Wound exploration: wound explored through full range of motion and entire depth of wound visualized     Wound extent: no foreign bodies/material noted, no muscle damage noted, no tendon damage noted and no vascular damage noted     Contaminated: no   Treatment:    Area cleansed with:  Chlorhexidine   Amount of cleaning:  Standard   Irrigation solution:  Sterile saline   Irrigation volume:  1 ml   Visualized foreign bodies/material removed: no     Debridement:  None   Undermining:  None   Scar revision: no   Skin repair:    Repair method:  Tissue adhesive Approximation:    Approximation:  Close Repair type:    Repair type:  Simple Post-procedure details:    Dressing:  Adhesive bandage   Procedure completion:  Tolerated well, no immediate complications (including critical care time)  Medications Ordered in UC Medications  Tdap (BOOSTRIX) injection 0.5 mL (0.5 mLs Intramuscular Given 01/22/21 2000)    Initial Impression / Assessment and Plan / UC Course  I have reviewed the triage vital signs and the nursing  notes.  Pertinent labs & imaging results that were available during my care of the patient were reviewed by me and considered in my medical decision making (see chart for details).  Clinical impression: 1.  Laceration to the right little finger without foreign body or damage to the nail 2.  Finger injury, right middle finger  Treatment plan: 1.  The findings and treatment plan were discussed in detail with the patient the patient was in agreement. 2.  We discussed doing sutures, but given the position, as well as it only being 1 cm I felt Dermabond was a better option.  She was in agreement.  Verbal consent was obtained.  Please see procedure. 3.  Educational handouts provided. 4.  Indicated that if she developed any fever, redness, warmth then she should follow-up with her primary care provider or come back here as this could be a sign of an infection.  If it was after hours she should go to the ER. 5.  Over-the-counter meds as needed. 6.  Tetanus booster was also given in the office.  Verbal consent was obtained. 7.  Patient was placed in a finger splint in order to optimize healing.  It was ordered and dispensed. 8.  She was discharged in stable condition will follow-up here as needed.    Final Clinical Impressions(s) / UC Diagnoses  Final diagnoses:  Laceration of right little finger without foreign body without damage to nail, initial encounter  Finger injury, initial encounter     Discharge Instructions      As we discussed, we used Dermabond to adhere the laceration.  Please keep the area clean and dry until you start to see it healing underneath the glue.  Given the position were going to put you in a finger splint to limit movement at that joint and optimize healing. Please see educational handouts. If you develop any fever redness or warmth then please come back here or go to your primary care provider.  If it is after hours and go to the ER as this may be a sign of an  infection. Over-the-counter meds as needed for any discomfort.     ED Prescriptions   None    PDMP not reviewed this encounter.   Verda Cumins, MD 01/22/21 2017

## 2021-01-22 NOTE — ED Triage Notes (Signed)
Pt c/o laceration to her right pinky finger with an aluminum can. Pt thinks her last Tetanus was about 8 years ago.

## 2021-06-05 ENCOUNTER — Ambulatory Visit (INDEPENDENT_AMBULATORY_CARE_PROVIDER_SITE_OTHER): Payer: BC Managed Care – PPO | Admitting: Obstetrics and Gynecology

## 2021-06-05 ENCOUNTER — Other Ambulatory Visit: Payer: Self-pay

## 2021-06-05 ENCOUNTER — Encounter: Payer: Self-pay | Admitting: Obstetrics and Gynecology

## 2021-06-05 ENCOUNTER — Other Ambulatory Visit (HOSPITAL_COMMUNITY)
Admission: RE | Admit: 2021-06-05 | Discharge: 2021-06-05 | Disposition: A | Discharge status: Home / Self Care | Payer: BC Managed Care – PPO | Source: Ambulatory Visit | Attending: Obstetrics and Gynecology | Admitting: Obstetrics and Gynecology

## 2021-06-05 VITALS — BP 110/70 | Ht 65.0 in | Wt 149.0 lb

## 2021-06-05 DIAGNOSIS — Z1151 Encounter for screening for human papillomavirus (HPV): Secondary | ICD-10-CM

## 2021-06-05 DIAGNOSIS — D219 Benign neoplasm of connective and other soft tissue, unspecified: Secondary | ICD-10-CM

## 2021-06-05 DIAGNOSIS — N6452 Nipple discharge: Secondary | ICD-10-CM

## 2021-06-05 DIAGNOSIS — Z01419 Encounter for gynecological examination (general) (routine) without abnormal findings: Secondary | ICD-10-CM | POA: Diagnosis not present

## 2021-06-05 DIAGNOSIS — Z30431 Encounter for routine checking of intrauterine contraceptive device: Secondary | ICD-10-CM

## 2021-06-05 DIAGNOSIS — Z124 Encounter for screening for malignant neoplasm of cervix: Secondary | ICD-10-CM | POA: Insufficient documentation

## 2021-06-05 DIAGNOSIS — K5901 Slow transit constipation: Secondary | ICD-10-CM | POA: Insufficient documentation

## 2021-06-05 NOTE — Patient Instructions (Signed)
I value your feedback and you entrusting us with your care. If you get a [facility] patient survey, I would appreciate you taking the time to let us know about your experience today. Thank you! ? ? ?

## 2021-06-05 NOTE — Progress Notes (Signed)
PCP:  [facility]   Chief Complaint  Patient presents with   Gynecologic Exam     HPI:      Ms. Kylynn Street is a 36 y.o. G2P1011 whose LMP was Patient's last menstrual period was 06/02/2021 (exact date)., presents today for her annual examination.  Her menses used to be absent but are not with random light bleeding with IUD, lasting a few days, light flow or with wiping only, occas with mild dysmen. Bleeding can also be triggered after straining for BM or once after sex. Hx of 2 large leio. GYN u/s PP 5/14 showed a 5 cm and 7 cm leio.   Sex activity: single partner, contraception - Mirena REplaced 04/29/18. No pain/bleeding usually. Last Pap: 08/07/16 Results were: no abnormalities /neg HPV DNA   There is no FH of breast cancer. There is no FH of ovarian cancer. The patient does occas self-breast exams. Noticed some grey/black nipple d/c (doesn't remember which nipple or both) after extensive manipulation a while back. Doesn't have sx spontaneously. Occas with clear d/c.   Tobacco use: The patient denies current or previous tobacco use. Alcohol use: none No drug use.  Exercise: moderately active  She does get adequate calcium and Vitamin D in her diet.  Has issues with constipation and straining. Has started miralax recently with some improvement but still straining sometimes. Hasn't tried fiber supp.   FH melanoma, pt sees derm yearly.   Patient Active Problem List   Diagnosis Date Noted   Leiomyoma 06/05/2021   Slow transit constipation 06/05/2021   Family history of melanoma 08/06/2015    Past Surgical History:  Procedure Laterality Date   DILATION AND CURETTAGE OF UTERUS     miscarriage    Family History  Problem Relation Age of Onset   Melanoma Mother    Heart attack Father        History of heart attacks   Hypertension Father    Colon cancer Maternal Uncle    Diabetes Paternal Aunt    Dementia Paternal Uncle    Heart attack Maternal  Grandmother    Prostate cancer Maternal Grandfather    Heart attack Paternal Grandfather     Social History   Socioeconomic History   Marital status: Married    Spouse name: Not on file   Number of children: Not on file   Years of education: Not on file   Highest education level: Not on file  Occupational History   Not on file  Tobacco Use   Smoking status: Never   Smokeless tobacco: Never  Vaping Use   Vaping Use: Never used  Substance and Sexual Activity   Alcohol use: No   Drug use: No   Sexual activity: Yes    Birth control/protection: I.U.D.    Comment: Mirena  Other Topics Concern   Not on file  Social History Narrative   Not on file   Social Determinants of Health   Financial Resource Strain: Not on file  Food Insecurity: Not on file  Transportation Needs: Not on file  Physical Activity: Not on file  Stress: Not on file  Social Connections: Not on file  Intimate Partner Violence: Not on file     Current Outpatient Medications:    levonorgestrel (MIRENA) 20 MCG/24HR IUD, by Intrauterine route., Disp: , Rfl:    loratadine (CLARITIN) 10 MG tablet, Take 10 mg by mouth daily., Disp: , Rfl:    Multiple Vitamin (MULTIVITAMIN) tablet, Take 1 tablet  by mouth daily., Disp: , Rfl:      ROS:  Review of Systems  Constitutional:  Negative for fatigue, fever and unexpected weight change.  Respiratory:  Negative for cough, shortness of breath and wheezing.   Cardiovascular:  Negative for chest pain, palpitations and leg swelling.  Gastrointestinal:  Negative for blood in stool, constipation, diarrhea, nausea and vomiting.  Endocrine: Negative for cold intolerance, heat intolerance and polyuria.  Genitourinary:  Positive for vaginal bleeding. Negative for dyspareunia, dysuria, flank pain, frequency, genital sores, hematuria, menstrual problem, pelvic pain, urgency, vaginal discharge and vaginal pain.  Musculoskeletal:  Negative for back pain, joint swelling and  myalgias.  Skin:  Negative for rash.  Neurological:  Negative for dizziness, syncope, light-headedness, numbness and headaches.  Hematological:  Negative for adenopathy.  Psychiatric/Behavioral:  Negative for agitation, confusion, sleep disturbance and suicidal ideas. The patient is not nervous/anxious.   BREAST: nipple d/c   Objective: BP 110/70    Ht 5\' 5"  (1.651 m)    Wt 149 lb (67.6 kg)    LMP 06/02/2021 (Exact Date)    BMI 24.79 kg/m    Physical Exam Constitutional:      Appearance: She is well-developed.  Genitourinary:     Vulva normal.     Genitourinary Comments: NO NIPPLE D/C BILAT WITH MANIPULATION     Right Labia: No rash, tenderness or lesions.    Left Labia: No tenderness, lesions or rash.    No vaginal discharge, erythema or tenderness.      Right Adnexa: not tender and no mass present.    Left Adnexa: not tender and no mass present.    No cervical friability or polyp.     IUD strings visualized.     Uterus is enlarged.     Uterus is not tender.     Uterine mass present. Breasts:    Right: No mass, nipple discharge, skin change or tenderness.     Left: No mass, nipple discharge, skin change or tenderness.  Neck:     Thyroid: No thyromegaly.  Cardiovascular:     Rate and Rhythm: Normal rate and regular rhythm.     Heart sounds: Normal heart sounds. No murmur heard. Pulmonary:     Effort: Pulmonary effort is normal.     Breath sounds: Normal breath sounds.  Abdominal:     Palpations: Abdomen is soft.     Tenderness: There is no abdominal tenderness. There is no guarding or rebound.  Musculoskeletal:        General: Normal range of motion.     Cervical back: Normal range of motion.  Lymphadenopathy:     Cervical: No cervical adenopathy.  Neurological:     General: No focal deficit present.     Mental Status: She is alert and oriented to person, place, and time.     Cranial Nerves: No cranial nerve deficit.  Skin:    General: Skin is warm and dry.   Psychiatric:        Mood and Affect: Mood normal.        Behavior: Behavior normal.        Thought Content: Thought content normal.        Judgment: Judgment normal.  Vitals reviewed.    Assessment/Plan: Encounter for annual routine gynecological examination  Cervical cancer screening - Plan: Cytology - PAP  Screening for HPV (human papillomavirus) - Plan: Cytology - PAP  Encounter for routine checking of intrauterine contraceptive device (IUD)--IUD strings in cx os. Has  8 yr indication now  [facility] uterus c/w 2 large leio; f/u prn pain/bleeding for GYN u/s f/u  Nipple discharge--with extensive manipulation, no sx today, no sx spontaneously. Follow sx. If persists, will check dx mammo.   Slow transit constipation--add fiber supp with lots of water. F/u prn.             GYN counsel breast self exam, adequate intake of calcium and vitamin D, diet and exercise     F/U  Return in about 1 year (around 06/05/2022).  Isatou Agredano B. Mckinzee Spirito, PA-C 06/05/2021 3:40 PM

## 2021-06-09 LAB — CYTOLOGY - PAP
Comment: NEGATIVE
Diagnosis: NEGATIVE
High risk HPV: NEGATIVE

## 2021-06-30 DIAGNOSIS — A09 Infectious gastroenteritis and colitis, unspecified: Secondary | ICD-10-CM | POA: Diagnosis not present

## 2021-07-08 DIAGNOSIS — L578 Other skin changes due to chronic exposure to nonionizing radiation: Secondary | ICD-10-CM | POA: Diagnosis not present

## 2021-07-08 DIAGNOSIS — Z86018 Personal history of other benign neoplasm: Secondary | ICD-10-CM | POA: Diagnosis not present

## 2021-07-08 DIAGNOSIS — L700 Acne vulgaris: Secondary | ICD-10-CM | POA: Diagnosis not present

## 2022-09-01 ENCOUNTER — Encounter (INDEPENDENT_AMBULATORY_CARE_PROVIDER_SITE_OTHER): Payer: Self-pay | Admitting: Vascular Surgery

## 2022-09-01 ENCOUNTER — Ambulatory Visit (INDEPENDENT_AMBULATORY_CARE_PROVIDER_SITE_OTHER): Payer: BC Managed Care – PPO | Admitting: Vascular Surgery

## 2022-09-01 VITALS — BP 111/70 | HR 76 | Resp 18 | Ht 65.0 in | Wt 149.4 lb

## 2022-09-01 DIAGNOSIS — L98491 Non-pressure chronic ulcer of skin of other sites limited to breakdown of skin: Secondary | ICD-10-CM | POA: Diagnosis not present

## 2022-09-01 DIAGNOSIS — I7300 Raynaud's syndrome without gangrene: Secondary | ICD-10-CM | POA: Diagnosis not present

## 2022-09-01 NOTE — Progress Notes (Signed)
Patient ID: Jaclyn Romero, female   DOB: 30-Aug-1985, 37 y.o.   MRN: [id number]  Chief Complaint  Patient presents with   New Patient (Initial Visit)    URGENT: np. consult. raynaud's disease without gangrene. left index finger + right middle finger. referred by Kennedy Bucker    HPI Jaclyn Romero is a 37 y.o. female.  I am asked to see the patient by Dr. Rosita Kea for evaluation of Raynaud's disease with ulcerations of fingertips.  The most severely affected digits are currently the left index finger which has some cracking of the skin of the right middle finger which has a scab and small ulceration.  She has purplish discoloration of multiple fingers.  She shows me pictures where her fingers will turn completely white throughout both hands.  She has some similar symptoms on the feet, but she never sees them because she is always in socks and shoes.  She has been trying to wear gloves frequently to avoid cold stimulation, but the recent swings in weather have left her more symptomatic.  She reports no wounds on her feet.  This is quite painful.  She was recently started on a calcium channel blocker.     Past Medical History:  Diagnosis Date   Carpal tunnel syndrome of right wrist    Fibroids    Tennis elbow 03/03/2018    Past Surgical History:  Procedure Laterality Date   DILATION AND CURETTAGE OF UTERUS     miscarriage     Family History  Problem Relation Age of Onset   Melanoma Mother    Heart attack Father        History of heart attacks   Hypertension Father    Colon cancer Maternal Uncle    Diabetes Paternal Aunt    Dementia Paternal Uncle    Heart attack Maternal Grandmother    Prostate cancer Maternal Grandfather    Heart attack Paternal Grandfather       Social History   Tobacco Use   Smoking status: Never   Smokeless tobacco: Never  Vaping Use   Vaping Use: Never used  Substance Use Topics   Alcohol use: No   Drug use: No     Allergies  Allergen  Reactions   Sulfa Antibiotics Hives    Current Outpatient Medications  Medication Sig Dispense Refill   amLODipine (NORVASC) 5 MG tablet Take by mouth.     levonorgestrel (MIRENA) 20 MCG/24HR IUD by Intrauterine route.     loratadine (CLARITIN) 10 MG tablet Take 10 mg by mouth daily.     Multiple Vitamin (MULTIVITAMIN) tablet Take 1 tablet by mouth daily.     spironolactone (ALDACTONE) 100 MG tablet Take 100 mg by mouth daily.     No current facility-administered medications for this visit.      REVIEW OF SYSTEMS (Negative unless checked)  Constitutional: [] Weight loss  [] Fever  [] Chills Cardiac: [] Chest pain   [] Chest pressure   [] Palpitations   [] Shortness of breath when laying flat   [] Shortness of breath at rest   [] Shortness of breath with exertion. Vascular:  [] Pain in legs with walking   [] Pain in legs at rest   [] Pain in legs when laying flat   [] Claudication   [] Pain in feet when walking  [] Pain in feet at rest  [] Pain in feet when laying flat   [] History of DVT   [] Phlebitis   [] Swelling in legs   [] Varicose veins   [x] Non-healing ulcers Pulmonary:   [] Uses  home oxygen   [] Productive cough   [] Hemoptysis   [] Wheeze  [] COPD   [] Asthma Neurologic:  [] Dizziness  [] Blackouts   [] Seizures   [] History of stroke   [] History of TIA  [] Aphasia   [] Temporary blindness   [] Dysphagia   [] Weakness or numbness in arms   [] Weakness or numbness in legs Musculoskeletal:  [] Arthritis   [] Joint swelling   [] Joint pain   [] Low back pain Hematologic:  [] Easy bruising  [] Easy bleeding   [] Hypercoagulable state   [] Anemic  [] Hepatitis Gastrointestinal:  [] Blood in stool   [] Vomiting blood  [] Gastroesophageal reflux/heartburn   [] Abdominal pain Genitourinary:  [] Chronic kidney disease   [] Difficult urination  [] Frequent urination  [] Burning with urination   [] Hematuria Skin:  [] Rashes   [x] Ulcers   [x] Wounds Psychological:  [] History of anxiety   []  History of major depression.    Physical  Exam BP 111/70 (BP Location: Right Arm)   Pulse 76   Resp 18   Ht 5\' 5"  (1.651 m)   Wt 149 lb 6.4 oz (67.8 kg)   BMI 24.86 kg/m  Gen:  WD/WN, NAD Head: [place]/AT, No temporalis wasting.  Ear/Nose/Throat: Hearing grossly intact, nares w/o erythema or drainage, oropharynx w/o Erythema/Exudate Eyes: Conjunctiva clear, sclera non-icteric  Neck: trachea midline.  No JVD.  Pulmonary:  Good air movement, respirations not labored, no use of accessory muscles  Cardiac: RRR, no JVD Vascular:  Vessel Right Left  Radial Palpable Palpable                                   Gastrointestinal:. No masses, surgical incisions, or scars. Musculoskeletal: M/S 5/5 throughout.  Ulceration of the fingertip of the right digit 3 and left digit 2, larger on the right than the left.  White discoloration of right digit 3 in the distal portion of the phalanx.  Purplish discoloration of the mid to distal left second digit. Neurologic: Sensation grossly intact in extremities.  Symmetrical.  Speech is fluent. Motor exam as listed above. Psychiatric: Judgment intact, Mood & affect appropriate for pt's clinical situation. Dermatologic: No rashes or ulcers noted.  No cellulitis or open wounds.    Radiology No results found.  Labs No results found for this or any previous visit (from the past 2160 hour(s)).  Assessment/Plan:  Raynaud's disease without gangrene Patient has quite severe Raynaud's disease that is most notable this time of year with the significant temperature changes and cold swings.  We reiterated the importance of cold avoidance which she already knows.  She was placed on a calcium channel blocker and I would agree with that as well.  We discussed warm water baths to help break the spasm at times.  We discussed local wound care for the wounds.  Given her severity of symptoms and young age, I think it was also reasonable to ensure that there is not some sort of autoimmune disease present and I have  referred her to rheumatology for full evaluation of this.  In addition, other less likely sources such as thoracic aortic pathology and subclavian artery aneurysms can be assessed with a CT scan of the chest.  We will see her back following her CT scan to discuss the results and help determine further treatment options.      Festus Barren 09/01/2022, 10:47 AM   This note was created with Dragon medical transcription system.  Any errors from dictation are unintentional.

## 2022-09-01 NOTE — Assessment & Plan Note (Signed)
The patient has ulcerations on the fingertips of both upper extremities.  Will get a CT angiogram of the chest but this is most likely due to severe Raynaud's disease.

## 2022-09-01 NOTE — Assessment & Plan Note (Signed)
Patient has quite severe Raynaud's disease that is most notable this time of year with the significant temperature changes and cold swings.  We reiterated the importance of cold avoidance which she already knows.  She was placed on a calcium channel blocker and I would agree with that as well.  We discussed warm water baths to help break the spasm at times.  We discussed local wound care for the wounds.  Given her severity of symptoms and young age, I think it was also reasonable to ensure that there is not some sort of autoimmune disease present and I have referred her to rheumatology for full evaluation of this.  In addition, other less likely sources such as thoracic aortic pathology and subclavian artery aneurysms can be assessed with a CT scan of the chest.  We will see her back following her CT scan to discuss the results and help determine further treatment options.

## 2022-09-01 NOTE — Patient Instructions (Signed)
Raynaud's Phenomenon  Raynaud's phenomenon is a condition that affects the blood vessels (arteries) that carry blood to the fingers and toes. The arteries that supply blood to the ears, lips, nipples, or the tip of the nose might also be affected. Raynaud's phenomenon causes the arteries to become narrow temporarily (spasm). As a result, the flow of blood to the affected areas is temporarily decreased. This usually occurs in response to cold temperatures or stress. During an attack, the skin in the affected areas turns white, then blue, and finally red. A person may also feel tingling or numbness in those areas. Attacks usually last for only a brief period, and then the blood flow to the area returns to normal. In most cases, Raynaud's phenomenon does not cause serious health problems. What are the causes? In many cases, the cause of this condition is not known. The condition may occur on its own (primary Raynaud's phenomenon) or may be associated with other diseases or factors (secondary Raynaud's phenomenon). Possible causes may include: Diseases or medical conditions that damage the arteries. Injuries and repetitive actions that hurt the hands or feet. Being exposed to certain chemicals. Taking medicines that narrow the arteries. Other medical conditions, such as lupus, scleroderma, rheumatoid arthritis, thyroid problems, blood disorders, Sjogren syndrome, or atherosclerosis. What increases the risk? The following factors may make you more likely to develop this condition: Being 20-40 years old. Being female. Having a family history of Raynaud's phenomenon. Living in a cold climate. Smoking. What are the signs or symptoms? Symptoms of this condition usually occur when you are exposed to cold temperatures or when you have emotional stress. The symptoms may last for a few minutes or up to several hours. They usually affect your fingers but may also affect your toes, nipples, lips, ears, or the  tip of your nose. Symptoms may include: Changes in skin color. The skin in the affected areas will turn pale or white. The skin may then change from white to bluish to red as normal blood flow returns to the area. Numbness, tingling, or pain in the affected areas. In severe cases, symptoms may include: Skin sores. Tissues decaying and dying (gangrene). How is this diagnosed? This condition may be diagnosed based on: Your symptoms and medical history. A physical exam. During the exam, you may be asked to put your hands in cold water to check for a reaction to cold temperature. Tests, such as: Blood tests to check for other diseases or conditions. A test to check the movement of blood through your arteries and veins (vascular ultrasound). A test in which the skin at the base of your fingernail is examined under a microscope (nailfold capillaroscopy). How is this treated? During an episode, you can take actions to help symptoms go away faster. Options include moving your arms around in a windmill pattern, warming your fingers under warm water, or placing your fingers in a warm body fold, such as your armpit. Long-term treatment for this condition often involves making lifestyle changes and taking steps to control your exposure to cold temperature. For more severe cases, medicine (calcium channel blockers) may be used to improve blood circulation. Follow these instructions at home: Avoiding cold temperatures Take these steps to avoid exposure to cold: If possible, stay indoors during cold weather. When you go outside during cold weather, dress in layers and wear mittens, a hat, a scarf, and warm footwear. Wear mittens or gloves when handling ice or frozen food. Use holders for glasses or cans containing   cold drinks. Let warm water run for a while before taking a shower or bath. Warm up the car before driving in cold weather. Lifestyle If possible, avoid stressful and emotional situations. Try  to find ways to manage your stress, such as: Exercise. Yoga. Meditation. Biofeedback. Do not use any products that contain nicotine or tobacco. These products include cigarettes, chewing tobacco, and vaping devices, such as e-cigarettes. If you need help quitting, ask your health care provider. Avoid secondhand smoke. Limit your use of caffeine. Switch to decaffeinated coffee, tea, and soda. Avoid chocolate. Avoid vibrating tools and machinery. General instructions Protect your hands and feet from injuries, cuts, or bruises. Avoid wearing tight rings or wristbands. Wear loose fitting socks and comfortable, roomy shoes. Take over-the-counter and prescription medicines only as told by your health care provider. Where to find support Raynaud's Association: www.raynauds.org Where to find more information National Institute of Arthritis and Musculoskeletal and Skin Diseases: www.niams.nih.gov Contact a health care provider if: Your discomfort becomes worse despite lifestyle changes. You develop sores on your fingers or toes that do not heal. You have breaks in the skin on your fingers or toes. You have a fever. You have pain or swelling in your joints. You have a rash. Your symptoms occur on only one side of your body. Get help right away if: Your fingers or toes turn black. You have severe pain in the affected areas. These symptoms may represent a serious problem that is an emergency. Do not wait to see if the symptoms will go away. Get medical help right away. Call your local emergency services (911 in the [place] Do not drive yourself to the hospital. Summary Raynaud's phenomenon is a condition that affects the arteries that carry blood to the fingers, toes, ears, lips, nipples, or the tip of the nose. In many cases, the cause of this condition is not known. Symptoms of this condition include changes in skin color along with numbness and tingling in the affected area. Treatment for  this condition includes lifestyle changes and reducing exposure to cold temperatures. Medicines may be used for severe cases of the condition. Contact your health care provider if your condition worsens despite treatment. This information is not intended to replace advice given to you by your health care provider. Make sure you discuss any questions you have with your health care provider. Document Revised: 07/16/2020 Document Reviewed: 07/16/2020 Elsevier Patient Education  2023 [employer]  

## 2022-10-08 ENCOUNTER — Ambulatory Visit (HOSPITAL_COMMUNITY)
Admission: RE | Admit: 2022-10-08 | Discharge: 2022-10-08 | Disposition: A | Discharge status: Home / Self Care | Payer: BC Managed Care – PPO | Source: Ambulatory Visit | Attending: Vascular Surgery | Admitting: Vascular Surgery

## 2022-10-08 DIAGNOSIS — L98491 Non-pressure chronic ulcer of skin of other sites limited to breakdown of skin: Secondary | ICD-10-CM

## 2022-10-08 MED ORDER — IOHEXOL 350 MG/ML SOLN
75.0000 mL | Freq: Once | INTRAVENOUS | Status: AC | PRN
  Administered 2022-10-08: 75 mL via INTRAVENOUS

## 2022-10-20 ENCOUNTER — Ambulatory Visit (INDEPENDENT_AMBULATORY_CARE_PROVIDER_SITE_OTHER): Payer: BC Managed Care – PPO | Admitting: Vascular Surgery

## 2022-10-20 VITALS — BP 109/73 | HR 61 | Resp 18 | Ht 64.0 in | Wt 150.0 lb

## 2022-10-20 DIAGNOSIS — I7300 Raynaud's syndrome without gangrene: Secondary | ICD-10-CM | POA: Diagnosis not present

## 2022-10-22 ENCOUNTER — Encounter (INDEPENDENT_AMBULATORY_CARE_PROVIDER_SITE_OTHER): Payer: Self-pay | Admitting: Vascular Surgery

## 2022-10-22 NOTE — Progress Notes (Signed)
MRN : [id number]  Jaclyn Romero is a 37 y.o. (Sep 27, 1985) female who presents with chief complaint of check circulation.  History of Present Illness:   Patient returns to the office today for follow-up regarding her Raynaud's changes.  She notes that with the warmer weather her fingers are doing much better the cracks/fissures are healed.  She has seen rheumatology and is continuing to follow with them.  CT angiogram of the chest dated 10/08/2022 is normal with respect to the vascular findings.  No aneurysms dissections or other abnormalities.  Current Meds  Medication Sig   amLODipine (NORVASC) 5 MG tablet Take by mouth.   Fluocinolone Acetonide 0.01 % OIL PLACE 5DROPS INTO BOTH EARS TWO TIMES DAILY FOR 7 DAYS USE 1 WEEK PER MONTH   levonorgestrel (MIRENA) 20 MCG/24HR IUD by Intrauterine route.   loratadine (CLARITIN) 10 MG tablet Take 10 mg by mouth daily.   Multiple Vitamin (MULTIVITAMIN) tablet Take 1 tablet by mouth daily.   NITRO-BID 2 % ointment SMARTSIG:0.5 Inch(es) Topical 3 Times Daily   spironolactone (ALDACTONE) 100 MG tablet Take 100 mg by mouth daily.    Past Medical History:  Diagnosis Date   Carpal tunnel syndrome of right wrist    Fibroids    Tennis elbow 03/03/2018    Past Surgical History:  Procedure Laterality Date   DILATION AND CURETTAGE OF UTERUS     miscarriage    Social History Social History   Tobacco Use   Smoking status: Never   Smokeless tobacco: Never  Vaping Use   Vaping Use: Never used  Substance Use Topics   Alcohol use: No   Drug use: No    Family History Family History  Problem Relation Age of Onset   Melanoma Mother    Heart attack Father        History of heart attacks   Hypertension Father    Colon cancer Maternal Uncle    Diabetes Paternal Aunt    Dementia Paternal Uncle    Heart attack Maternal Grandmother    Prostate cancer Maternal Grandfather     Heart attack Paternal Grandfather     Allergies  Allergen Reactions   Sulfa Antibiotics Hives     REVIEW OF SYSTEMS (Negative unless checked)  Constitutional: [] Weight loss  [] Fever  [] Chills Cardiac: [] Chest pain   [] Chest pressure   [] Palpitations   [] Shortness of breath when laying flat   [] Shortness of breath with exertion. Vascular:  [x] Pain in legs with walking   [] Pain in legs at rest  [] History of DVT   [] Phlebitis   [] Swelling in legs   [] Varicose veins   [] Non-healing ulcers Pulmonary:   [] Uses home oxygen   [] Productive cough   [] Hemoptysis   [] Wheeze  [] COPD   [] Asthma Neurologic:  [] Dizziness   [] Seizures   [] History of stroke   [] History of TIA  [] Aphasia   [] Vissual changes   [] Weakness or numbness in arm   [] Weakness or numbness in leg Musculoskeletal:   [] Joint swelling   [] Joint pain   [] Low back pain Hematologic:  [] Easy bruising  [] Easy bleeding   []   Hypercoagulable state   [] Anemic Gastrointestinal:  [] Diarrhea   [] Vomiting  [] Gastroesophageal reflux/heartburn   [] Difficulty swallowing. Genitourinary:  [] Chronic kidney disease   [] Difficult urination  [] Frequent urination   [] Blood in urine Skin:  [] Rashes   [] Ulcers  Psychological:  [] History of anxiety   []  History of major depression.  Physical Examination  Vitals:   10/20/22 1534  BP: 109/73  Pulse: 61  Resp: 18  Weight: 150 lb (68 kg)  Height: 5\' 4"  (1.626 m)   Body mass index is 25.75 kg/m. Gen: WD/WN, NAD Head: [place]/AT, No temporalis wasting.  Ear/Nose/Throat: Hearing grossly intact, nares w/o erythema or drainage Eyes: PER, EOMI, sclera nonicteric.  Neck: Supple, no masses.  No bruit or JVD.  Pulmonary:  Good air movement, no audible wheezing, no use of accessory muscles.  Cardiac: RRR, normal S1, S2, no Murmurs. Vascular: Fingers without significant Raynaud's changes  Radial Palpable Palpable  Gastrointestinal: soft, non-distended. No guarding/no peritoneal signs.  Musculoskeletal: M/S 5/5  throughout.  No visible deformity.  Neurologic: CN 2-12 intact. Pain and light touch intact in extremities.  Symmetrical.  Speech is fluent. Motor exam as listed above. Psychiatric: Judgment intact, Mood & affect appropriate for pt's clinical situation. Dermatologic: No rashes or ulcers noted.  No changes consistent with cellulitis.   CBC Lab Results  Component Value Date   WBC 3.4 (L) 05/12/2019   HGB 13.7 05/12/2019   HCT 40.3 05/12/2019   MCV 91.2 05/12/2019   PLT 226 05/12/2019    BMET    Component Value Date/Time   NA 137 05/12/2019 1125   K 4.2 05/12/2019 1125   CL 104 05/12/2019 1125   CO2 24 05/12/2019 1125   GLUCOSE 97 05/12/2019 1125   BUN 20 05/12/2019 1125   CREATININE 0.91 05/12/2019 1125   CALCIUM 9.2 05/12/2019 1125   GFRNONAA >60 05/12/2019 1125   GFRAA >60 05/12/2019 1125   CrCl cannot be calculated (Patient's most recent lab result is older than the maximum 21 days allowed.).  COAG No results found for: "INR", "PROTIME"  Radiology CT ANGIO CHEST AORTA W/ & OR WO/CM & GATING ([facility] ONLY)  Result Date: 10/08/2022 CLINICAL DATA:  , Raynaud's disease with finger tip ulceration EXAM: CT ANGIOGRAPHY CHEST WITH CONTRAST TECHNIQUE: Multidetector CT imaging of the chest was performed using the standard protocol during bolus administration of intravenous contrast. Multiplanar CT image reconstructions and MIPs were obtained to evaluate the vascular anatomy. RADIATION DOSE REDUCTION: This exam was performed according to the departmental dose-optimization program which includes automated exposure control, adjustment of the mA and/or kV according to patient size and/or use of iterative reconstruction technique. CONTRAST:  75mL OMNIPAQUE IOHEXOL 350 MG/ML SOLN COMPARISON:  None Available. FINDINGS: Cardiovascular: SVC patent. Heart size normal. No pericardial effusion. Satisfactory opacification of pulmonary arteries noted, and there is no evidence of pulmonary emboli.  Motion degradation in the infrahilar region. Adequate contrast opacification of the thoracic aorta with no evidence of dissection, aneurysm, or stenosis. There is classic 3-vessel brachiocephalic arch anatomy without proximal stenosis. Bilateral subclavian and axillary arteries unremarkable. Mediastinum/Nodes: No mediastinal hematoma, mass or adenopathy. Lungs/Pleura: No pleural effusion. No pneumothorax. Lungs are clear. Upper Abdomen: No acute abnormality. Mild narrowing near the origin of the celiac axis related to the median arcuate ligament of the diaphragm. Musculoskeletal: No chest wall abnormality. No acute or significant osseous findings. Review of the MIP images confirms the above findings. IMPRESSION: Negative for PE, thoracic aortic dissection, or other significant vascular lesion. Electronically  Signed   By: Corlis Leak M.D.   On: 10/08/2022 16:14     Assessment/Plan 1. Raynaud's disease without gangrene Recommend:  The patient is currently tolerating the Raynaud's changes fairly well.  Lengthy discussion regarding keeping the feet and the hands warm; gloves, washing with only warm water (especially avoiding cold water immersion) and using wool socks, specifically Pacas was recommended.  Continue using Norvasc.  The use of Pletal was also reviewed as well as the fact that it is an off label use which is typically reserved for failures of Norvasc and conservative therapy.    The patient will follow up PRN if the changes worsen or persist.          Levora Dredge, MD  10/22/2022 1:07 PM

## 2023-07-14 ENCOUNTER — Other Ambulatory Visit: Payer: Self-pay | Admitting: Medical Genetics

## 2023-08-11 ENCOUNTER — Other Ambulatory Visit
Admission: RE | Admit: 2023-08-11 | Discharge: 2023-08-11 | Disposition: A | Discharge status: Home / Self Care | Payer: Self-pay | Source: Ambulatory Visit | Attending: Medical Genetics | Admitting: Medical Genetics

## 2023-08-27 LAB — GENECONNECT MOLECULAR SCREEN: Genetic Analysis Overall Interpretation: NEGATIVE

## 2023-10-12 ENCOUNTER — Encounter (INDEPENDENT_AMBULATORY_CARE_PROVIDER_SITE_OTHER): Payer: Self-pay
